# Patient Record
Sex: Female | Born: 1960
Health system: Southern US, Community
[De-identification: ages and names within clinical notes are randomized; demographics above are authoritative.]

## PROBLEM LIST (undated history)

## (undated) DIAGNOSIS — B019 Varicella without complication: Secondary | ICD-10-CM

## (undated) DIAGNOSIS — F419 Anxiety disorder, unspecified: Secondary | ICD-10-CM

## (undated) DIAGNOSIS — F32A Depression, unspecified: Secondary | ICD-10-CM

## (undated) DIAGNOSIS — E785 Hyperlipidemia, unspecified: Secondary | ICD-10-CM

## (undated) DIAGNOSIS — M810 Age-related osteoporosis without current pathological fracture: Secondary | ICD-10-CM

## (undated) DIAGNOSIS — K921 Melena: Secondary | ICD-10-CM

## (undated) HISTORY — DX: Depression, unspecified: F32.A

## (undated) HISTORY — DX: Anxiety disorder, unspecified: F41.9

## (undated) HISTORY — DX: Varicella without complication: B01.9

## (undated) HISTORY — DX: Age-related osteoporosis without current pathological fracture: M81.0

## (undated) HISTORY — DX: Melena: K92.1

## (undated) HISTORY — DX: Hyperlipidemia, unspecified: E78.5

## (undated) HISTORY — PX: COLOSTOMY: SHX63

---

## 2011-03-04 HISTORY — PX: BREAST BIOPSY: SHX20

## 2012-02-17 LAB — HM COLONOSCOPY

## 2019-05-26 LAB — LIPID PANEL
Cholesterol: 185 (ref 0–200)
HDL: 80 — AB (ref 35–70)
LDL Cholesterol: 91
Triglycerides: 59 (ref 40–160)

## 2019-05-26 LAB — VITAMIN D 25 HYDROXY (VIT D DEFICIENCY, FRACTURES): Vit D, 25-Hydroxy: 38

## 2019-05-26 LAB — CBC AND DIFFERENTIAL
HCT: 49 — AB (ref 36–46)
Hemoglobin: 15.4 (ref 12.0–16.0)
Platelets: 274 (ref 150–399)
WBC: 7.2

## 2019-05-26 LAB — BASIC METABOLIC PANEL
BUN: 17 (ref 4–21)
CO2: 27 — AB (ref 13–22)
Chloride: 103 (ref 99–108)
Creatinine: 0.9 (ref 0.5–1.1)
Glucose: 75
Potassium: 4.6 (ref 3.4–5.3)
Sodium: 141 (ref 137–147)

## 2019-05-26 LAB — HEPATIC FUNCTION PANEL
ALT: 15 (ref 7–35)
AST: 19 (ref 13–35)
Alkaline Phosphatase: 35 (ref 25–125)
Bilirubin, Total: 0.5

## 2019-05-26 LAB — COMPREHENSIVE METABOLIC PANEL: Calcium: 9.6 (ref 8.7–10.7)

## 2019-05-26 LAB — VITAMIN B12: Vitamin B-12: 690

## 2019-06-15 LAB — HM PAP SMEAR

## 2019-06-15 LAB — HM MAMMOGRAPHY: HM Mammogram: NORMAL (ref 0–4)

## 2019-12-12 ENCOUNTER — Other Ambulatory Visit (HOSPITAL_COMMUNITY): Payer: Self-pay

## 2019-12-22 ENCOUNTER — Other Ambulatory Visit (HOSPITAL_COMMUNITY): Payer: Self-pay

## 2020-01-09 MED FILL — CITALOPRAM HBR 20 MG TABLET: 20 | 60 days supply | Qty: 60 | Fill #0

## 2020-01-10 MED FILL — ALENDRONATE NA 70 MG TAB: 70 | 56 days supply | Qty: 8 | Fill #0

## 2020-01-17 MED FILL — SIMVASTATIN 40 MG TABLET: 40 | 30 days supply | Qty: 30 | Fill #0

## 2020-02-06 ENCOUNTER — Telehealth: Payer: Self-pay | Admitting: Endocrinology

## 2020-02-06 ENCOUNTER — Other Ambulatory Visit: Payer: Self-pay

## 2020-02-06 ENCOUNTER — Ambulatory Visit: Payer: 59 | Admitting: Endocrinology

## 2020-02-06 ENCOUNTER — Encounter: Payer: Self-pay | Admitting: Endocrinology

## 2020-02-06 DIAGNOSIS — M810 Age-related osteoporosis without current pathological fracture: Secondary | ICD-10-CM

## 2020-02-06 MED ORDER — ALENDRONATE SODIUM 70 MG PO TABS
70.0000 mg | ORAL_TABLET | ORAL | 3 refills | Status: DC
Start: 2020-02-06 — End: 2020-03-13

## 2020-02-06 NOTE — Progress Notes (Signed)
 Subjective:    Patient ID: Angela Lam, female    DOB: 07/29/1960, 59 y.o.   MRN: [id number]  HPI Pt is referred by Dr Mendolsohn, for osteoporosis.  Pt was noted to have osteoporosis in 2016.  She first took Actonel 2019-2020.  She then took a course of Tymlos 2020-2021.  She has been on Fosamax since then.  Only bony fracture was left little toe (2018--traumatic).  She had menopause at age 50.  She has no history of any of the following: multiple myeloma, renal dz, thyroid problems, steroids, alcoholism, smoking, liver dz, and  hyperparathyroidism.  She does not take heparin or anticonvulsants.  She takes vit-D, 1600 units/day.  Main symptom is low back pain.    Social History   Socioeconomic History  . Marital status: Married    Spouse name: Not on file  . Number of children: Not on file  . Years of education: Not on file  . Highest education level: Not on file  Occupational History  . Not on file  Tobacco Use  . Smoking status: Not on file  . Smokeless tobacco: Not on file  Substance and Sexual Activity  . Alcohol use: Not on file  . Drug use: Not on file  . Sexual activity: Not on file  Other Topics Concern  . Not on file  Social History Narrative  . Not on file   Social Determinants of Health   Financial Resource Strain: Not on file  Food Insecurity: Not on file  Transportation Needs: Not on file  Physical Activity: Not on file  Stress: Not on file  Social Connections: Not on file  Intimate Partner Violence: Not on file    Current Outpatient Medications on File Prior to Visit  Medication Sig Dispense Refill  . Calcium Carbonate-Vitamin D (CALCIUM 600+D) 600-400 MG-UNIT tablet     . citalopram (CELEXA) 20 MG tablet Take 20 mg by mouth daily.    . simvastatin (ZOCOR) 40 MG tablet Take 40 mg by mouth at bedtime.     No current facility-administered medications on file prior to visit.    Not on File  Family History  Problem Relation Age of Onset  .  Osteoporosis Maternal Grandmother     BP 118/70   Pulse 88   Ht 5' 1.5" (1.562 m)   Wt 131 lb 6.4 oz (59.6 kg)   SpO2 99%   BMI 24.43 kg/m    Review of Systems denies heartburn, cold intolerance, falls, muscle cramps, and memory loss.  She has gained a few lbs.       Objective:   Physical Exam VITAL SIGNS:  See vs page GENERAL: no distress SPINE: no kyphosis GAIT: normal and steady  DEXA (1/21) worst T-score was -2.4 (RFN).  Ca++=10.0 Creat=0.8  I have reviewed outside records, and summarized:  Pt was noted to have osteoporosis, and referred here.  She was seen in 2020, when BMD was worse.  Tymlos was added.       Assessment & Plan:  Osteoporosis, new to me.  Uncontrolled.    Patient Instructions  Please continue the same Fosamax We are requesting prior authorization, for Prolia.   you will receive a phone call, about a day and time for an appointment. Please continue the same Vitamin-D. Please come back for a follow-up appointment in 6 months.      Osteoporosis  Osteoporosis is thinning and loss of density in your bones. Osteoporosis makes bones more brittle and fragile and more   likely to break (fracture). Over time, osteoporosis can cause your bones to become so weak that they fracture after a minor fall. Bones in the hip, wrist, and spine are most likely to fracture due to osteoporosis. What are the causes? The exact cause of this condition is not known. What increases the risk? You may be at greater risk for osteoporosis if you:  Have a family history of the condition.  Have poor nutrition.  Use steroid medicines, such as prednisone.  Are female.  Are age 50 or older.  Smoke or have a history of smoking.  Are not physically active (are sedentary).  Are white (Caucasian) or of Asian descent.  Have a small body frame.  Take certain medicines, such as antiseizure medicines. What are the signs or symptoms? A fracture might be the first sign of  osteoporosis, especially if the fracture results from a fall or injury that usually would not cause a bone to break. Other signs and symptoms include:  Pain in the neck or low back.  Stooped posture.  Loss of height. How is this diagnosed? This condition may be diagnosed based on:  Your medical history.  A physical exam.  A bone mineral density test, also called a DXA or DEXA test (dual-energy X-ray absorptiometry test). This test uses X-rays to measure the amount of minerals in your bones. How is this treated? The goal of treatment is to strengthen your bones and lower your risk for a fracture. Treatment may involve:  Making lifestyle changes, such as: ? Including foods with more calcium and vitamin D in your diet. ? Doing weight-bearing and muscle-strengthening exercises. ? Stopping tobacco use. ? Limiting alcohol intake.  Taking medicine to slow the process of bone loss or to increase bone density.  Taking daily supplements of calcium and vitamin D.  Taking hormone replacement medicines, such as estrogen for women and testosterone for men.  Monitoring your levels of calcium and vitamin D. Follow these instructions at home:  Activity  Exercise as told by your health care provider. Ask your health care provider what exercises and activities are safe for you. You should do: ? Exercises that make you work against gravity (weight-bearing exercises), such as tai chi, yoga, or walking. ? Exercises to strengthen muscles, such as lifting weights. Lifestyle  Limit alcohol intake to no more than 1 drink a day for nonpregnant women and 2 drinks a day for men. One drink equals 12 oz of beer, 5 oz of wine, or 1 oz of hard liquor.  Do not use any products that contain nicotine or tobacco, such as cigarettes and e-cigarettes. If you need help quitting, ask your health care provider. Preventing falls  Use devices to help you move around (mobility aids) as needed, such as canes,  walkers, scooters, or crutches.  Keep rooms well-lit and clutter-free.  Remove tripping hazards from walkways, including cords and throw rugs.  Install grab bars in bathrooms and safety rails on stairs.  Use rubber mats in the bathroom and other areas that are often wet or slippery.  Wear closed-toe shoes that fit well and support your feet. Wear shoes that have rubber soles or low heels.  Review your medicines with your health care provider. Some medicines can cause dizziness or changes in blood pressure, which can increase your risk of falling. General instructions  Include calcium and vitamin D in your diet. Calcium is important for bone health, and vitamin D helps your body to absorb calcium. Good sources of   calcium and vitamin D include: ? Certain fatty fish, such as salmon and tuna. ? Products that have calcium and vitamin D added to them (fortified products), such as fortified cereals. ? Egg yolks. ? Cheese. ? Liver.  Take over-the-counter and prescription medicines only as told by your health care provider.  Keep all follow-up visits as told by your health care provider. This is important. Contact a health care provider if:  You have never been screened for osteoporosis and you are: ? A woman who is age 65 or older. ? A man who is age 70 or older. Get help right away if:  You fall or injure yourself. Summary  Osteoporosis is thinning and loss of density in your bones. This makes bones more brittle and fragile and more likely to break (fracture),even with minor falls.  The goal of treatment is to strengthen your bones and reduce your risk for a fracture.  Include calcium and vitamin D in your diet. Calcium is important for bone health, and vitamin D helps your body to absorb calcium.  Talk with your health care provider about screening for osteoporosis if you are a woman who is age 65 or older, or a man who is age 70 or older. This information is not intended to  replace advice given to you by your health care provider. Make sure you discuss any questions you have with your health care provider. Document Revised: 01/30/2017 Document Reviewed: 12/12/2016 Elsevier Patient Education  2020 [employer]    

## 2020-02-06 NOTE — Patient Instructions (Addendum)
Please continue the same Fosamax We are requesting prior authorization, for Prolia.   you will receive a phone call, about a day and time for an appointment. Please continue the same Vitamin-D. Please come back for a follow-up appointment in 6 months.      Osteoporosis  Osteoporosis is thinning and loss of density in your bones. Osteoporosis makes bones more brittle and fragile and more likely to break (fracture). Over time, osteoporosis can cause your bones to become so weak that they fracture after a minor fall. Bones in the hip, wrist, and spine are most likely to fracture due to osteoporosis. What are the causes? The exact cause of this condition is not known. What increases the risk? You may be at greater risk for osteoporosis if you:  Have a family history of the condition.  Have poor nutrition.  Use steroid medicines, such as prednisone.  Are female.  Are age 19 or older.  Smoke or have a history of smoking.  Are not physically active (are sedentary).  Are white (Caucasian) or of Asian descent.  Have a small body frame.  Take certain medicines, such as antiseizure medicines. What are the signs or symptoms? A fracture might be the first sign of osteoporosis, especially if the fracture results from a fall or injury that usually would not cause a bone to break. Other signs and symptoms include:  Pain in the neck or low back.  Stooped posture.  Loss of height. How is this diagnosed? This condition may be diagnosed based on:  Your medical history.  A physical exam.  A bone mineral density test, also called a DXA or DEXA test (dual-energy X-ray absorptiometry test). This test uses X-rays to measure the amount of minerals in your bones. How is this treated? The goal of treatment is to strengthen your bones and lower your risk for a fracture. Treatment may involve:  Making lifestyle changes, such as: ? Including foods with more calcium and vitamin D in your  diet. ? Doing weight-bearing and muscle-strengthening exercises. ? Stopping tobacco use. ? Limiting alcohol intake.  Taking medicine to slow the process of bone loss or to increase bone density.  Taking daily supplements of calcium and vitamin D.  Taking hormone replacement medicines, such as estrogen for women and testosterone for men.  Monitoring your levels of calcium and vitamin D. Follow these instructions at home:  Activity  Exercise as told by your health care provider. Ask your health care provider what exercises and activities are safe for you. You should do: ? Exercises that make you work against gravity (weight-bearing exercises), such as tai chi, yoga, or walking. ? Exercises to strengthen muscles, such as lifting weights. Lifestyle  Limit alcohol intake to no more than 1 drink a day for nonpregnant women and 2 drinks a day for men. One drink equals 12 oz of beer, 5 oz of wine, or 1 oz of hard liquor.  Do not use any products that contain nicotine or tobacco, such as cigarettes and e-cigarettes. If you need help quitting, ask your health care provider. Preventing falls  Use devices to help you move around (mobility aids) as needed, such as canes, walkers, scooters, or crutches.  Keep rooms well-lit and clutter-free.  Remove tripping hazards from walkways, including cords and throw rugs.  Install grab bars in bathrooms and safety rails on stairs.  Use rubber mats in the bathroom and other areas that are often wet or slippery.  Wear closed-toe shoes that fit well  and support your feet. Wear shoes that have rubber soles or low heels.  Review your medicines with your health care provider. Some medicines can cause dizziness or changes in blood pressure, which can increase your risk of falling. General instructions  Include calcium and vitamin D in your diet. Calcium is important for bone health, and vitamin D helps your body to absorb calcium. Good sources of calcium  and vitamin D include: ? Certain fatty fish, such as salmon and tuna. ? Products that have calcium and vitamin D added to them (fortified products), such as fortified cereals. ? Egg yolks. ? Cheese. ? Liver.  Take over-the-counter and prescription medicines only as told by your health care provider.  Keep all follow-up visits as told by your health care provider. This is important. Contact a health care provider if:  You have never been screened for osteoporosis and you are: ? A woman who is age 70 or older. ? A man who is age 2 or older. Get help right away if:  You fall or injure yourself. Summary  Osteoporosis is thinning and loss of density in your bones. This makes bones more brittle and fragile and more likely to break (fracture),even with minor falls.  The goal of treatment is to strengthen your bones and reduce your risk for a fracture.  Include calcium and vitamin D in your diet. Calcium is important for bone health, and vitamin D helps your body to absorb calcium.  Talk with your health care provider about screening for osteoporosis if you are a woman who is age 60 or older, or a man who is age 67 or older. This information is not intended to replace advice given to you by your health care provider. Make sure you discuss any questions you have with your health care provider. Document Revised: 01/30/2017 Document Reviewed: 12/12/2016 Elsevier Patient Education  2020 [employer].

## 2020-02-06 NOTE — Telephone Encounter (Signed)
Please start PA.  Thank you.

## 2020-02-06 NOTE — Telephone Encounter (Signed)
Please do PA for Prolia, and notify pt when she can come in for her first shot.  TY

## 2020-02-09 DIAGNOSIS — M810 Age-related osteoporosis without current pathological fracture: Secondary | ICD-10-CM | POA: Insufficient documentation

## 2020-02-11 NOTE — Telephone Encounter (Signed)
Patient submitted to the portal for insurance verification

## 2020-02-20 MED FILL — SIMVASTATIN 40 MG TABLET: 40 | 30 days supply | Qty: 30 | Fill #1

## 2020-02-28 NOTE — Telephone Encounter (Signed)
Patient called wanting to check the status of Prolia.

## 2020-03-07 ENCOUNTER — Other Ambulatory Visit (HOSPITAL_COMMUNITY): Payer: Self-pay

## 2020-03-08 MED FILL — CITALOPRAM HBR 20 MG TABLET: 20 | 90 days supply | Qty: 90 | Fill #0

## 2020-03-13 ENCOUNTER — Other Ambulatory Visit: Payer: Self-pay | Admitting: Endocrinology

## 2020-03-13 MED FILL — ALENDRONATE NA 70 MG TAB: 70 | 56 days supply | Qty: 8 | Fill #0

## 2020-03-21 ENCOUNTER — Other Ambulatory Visit: Payer: Self-pay

## 2020-03-21 ENCOUNTER — Ambulatory Visit: Payer: 59 | Admitting: Internal Medicine

## 2020-03-21 ENCOUNTER — Encounter: Payer: Self-pay | Admitting: Internal Medicine

## 2020-03-21 VITALS — BP 122/80 | HR 93 | Temp 98.5°F | Resp 18 | Ht 62.0 in | Wt 131.6 lb

## 2020-03-21 DIAGNOSIS — Z23 Encounter for immunization: Secondary | ICD-10-CM

## 2020-03-21 DIAGNOSIS — E785 Hyperlipidemia, unspecified: Secondary | ICD-10-CM | POA: Diagnosis not present

## 2020-03-21 MED ORDER — SIMVASTATIN 40 MG PO TABS: 40.0000 mg | ORAL_TABLET | Freq: Every day | ORAL | 1 refills | Status: DC

## 2020-03-21 NOTE — Patient Instructions (Signed)
Health Maintenance, Female Adopting a healthy lifestyle and getting preventive care are important in promoting health and wellness. Ask your health care provider about:  The right schedule for you to have regular tests and exams.  Things you can do on your own to prevent diseases and keep yourself healthy. What should I know about diet, weight, and exercise? Eat a healthy diet  Eat a diet that includes plenty of vegetables, fruits, low-fat dairy products, and lean protein.  Do not eat a lot of foods that are high in solid fats, added sugars, or sodium.   Maintain a healthy weight Body mass index (BMI) is used to identify weight problems. It estimates body fat based on height and weight. Your health care provider can help determine your BMI and help you achieve or maintain a healthy weight. Get regular exercise Get regular exercise. This is one of the most important things you can do for your health. Most adults should:  Exercise for at least 150 minutes each week. The exercise should increase your heart rate and make you sweat (moderate-intensity exercise).  Do strengthening exercises at least twice a week. This is in addition to the moderate-intensity exercise.  Spend less time sitting. Even light physical activity can be beneficial. Watch cholesterol and blood lipids Have your blood tested for lipids and cholesterol at 59 years of age, then have this test every 5 years. Have your cholesterol levels checked more often if:  Your lipid or cholesterol levels are high.  You are older than 59 years of age.  You are at high risk for heart disease. What should I know about cancer screening? Depending on your health history and family history, you may need to have cancer screening at various ages. This may include screening for:  Breast cancer.  Cervical cancer.  Colorectal cancer.  Skin cancer.  Lung cancer. What should I know about heart disease, diabetes, and high blood  pressure? Blood pressure and heart disease  High blood pressure causes heart disease and increases the risk of stroke. This is more likely to develop in people who have high blood pressure readings, are of African descent, or are overweight.  Have your blood pressure checked: ? Every 3-5 years if you are 18-39 years of age. ? Every year if you are 40 years old or older. Diabetes Have regular diabetes screenings. This checks your fasting blood sugar level. Have the screening done:  Once every three years after age 40 if you are at a normal weight and have a low risk for diabetes.  More often and at a younger age if you are overweight or have a high risk for diabetes. What should I know about preventing infection? Hepatitis B If you have a higher risk for hepatitis B, you should be screened for this virus. Talk with your health care provider to find out if you are at risk for hepatitis B infection. Hepatitis C Testing is recommended for:  Everyone born from 1945 through 1965.  Anyone with known risk factors for hepatitis C. Sexually transmitted infections (STIs)  Get screened for STIs, including gonorrhea and chlamydia, if: ? You are sexually active and are younger than 59 years of age. ? You are older than 59 years of age and your health care provider tells you that you are at risk for this type of infection. ? Your sexual activity has changed since you were last screened, and you are at increased risk for chlamydia or gonorrhea. Ask your health care provider   if you are at risk.  Ask your health care provider about whether you are at high risk for HIV. Your health care provider may recommend a prescription medicine to help prevent HIV infection. If you choose to take medicine to prevent HIV, you should first get tested for HIV. You should then be tested every 3 months for as long as you are taking the medicine. Pregnancy  If you are about to stop having your period (premenopausal) and  you may become pregnant, seek counseling before you get pregnant.  Take 400 to 800 micrograms (mcg) of folic acid every day if you become pregnant.  Ask for birth control (contraception) if you want to prevent pregnancy. Osteoporosis and menopause Osteoporosis is a disease in which the bones lose minerals and strength with aging. This can result in bone fractures. If you are 65 years old or older, or if you are at risk for osteoporosis and fractures, ask your health care provider if you should:  Be screened for bone loss.  Take a calcium or vitamin D supplement to lower your risk of fractures.  Be given hormone replacement therapy (HRT) to treat symptoms of menopause. Follow these instructions at home: Lifestyle  Do not use any products that contain nicotine or tobacco, such as cigarettes, e-cigarettes, and chewing tobacco. If you need help quitting, ask your health care provider.  Do not use street drugs.  Do not share needles.  Ask your health care provider for help if you need support or information about quitting drugs. Alcohol use  Do not drink alcohol if: ? Your health care provider tells you not to drink. ? You are pregnant, may be pregnant, or are planning to become pregnant.  If you drink alcohol: ? Limit how much you use to 0-1 drink a day. ? Limit intake if you are breastfeeding.  Be aware of how much alcohol is in your drink. In the [place] one drink equals one 12 oz bottle of beer (355 mL), one 5 oz glass of wine (148 mL), or one 1 oz glass of hard liquor (44 mL). General instructions  Schedule regular health, dental, and eye exams.  Stay current with your vaccines.  Tell your health care provider if: ? You often feel depressed. ? You have ever been abused or do not feel safe at home. Summary  Adopting a healthy lifestyle and getting preventive care are important in promoting health and wellness.  Follow your health care provider's instructions about healthy  diet, exercising, and getting tested or screened for diseases.  Follow your health care provider's instructions on monitoring your cholesterol and blood pressure. This information is not intended to replace advice given to you by your health care provider. Make sure you discuss any questions you have with your health care provider. Document Revised: 02/10/2018 Document Reviewed: 02/10/2018 Elsevier Patient Education  2021 [employer].  

## 2020-03-21 NOTE — Progress Notes (Signed)
Subjective:  Patient ID: Angela Lam, female    DOB: 12-28-1960  Age: 60 y.o. MRN: [id number]  CC: New Patient (Initial Visit) (No concerns at this time. )  This visit occurred during the SARS-CoV-2 public health emergency.  Safety protocols were in place, including screening questions prior to the visit, additional usage of staff PPE, and extensive cleaning of exam room while observing appropriate contact time as indicated for disinfecting solutions.    HPI Angela Lam presents for establishing.  She recently moved from [place] to in [facility].  She has felt well recently and offers no complaints.  She has no issues or concerns today.  Just wants to establish with a new PCP.  History Angela Lam has a past medical history of Blood in stool, Chicken pox, Depression, and Hyperlipidemia.   She has a past surgical history that includes Breast biopsy.   Her family history includes Arthritis in her sister; Asthma in her mother and sister; COPD in her mother and sister; Cancer in her father and mother; Depression in her sister; Diabetes in her mother and sister; Early death in her mother; Heart attack in her mother and sister; Heart disease in her father, mother, and sister; Hyperlipidemia in her mother and sister; Hypertension in her sister; Kidney disease in her mother; Miscarriages / Stillbirths in her mother; Osteoporosis in her maternal grandmother; Stroke in her mother.She reports that she has never smoked. She has never used smokeless tobacco. She reports current alcohol use. She reports that she does not use drugs.  Outpatient Medications Prior to Visit  Medication Sig Dispense Refill  . alendronate (FOSAMAX) 70 MG tablet TAKE 1 TABLET BY MOUTH ONCE A WEEK 30 MINUTES BEFORE FIRST MEAL OR BEVERAGE OF THE DAY. 8 tablet 0  . aspirin EC 81 MG tablet Take 81 mg by mouth every other day.    . Calcium Carbonate-Vitamin D 600-400 MG-UNIT tablet     . citalopram (CELEXA) 20  MG tablet Take 20 mg by mouth daily.    . simvastatin (ZOCOR) 40 MG tablet Take 40 mg by mouth at bedtime.     No facility-administered medications prior to visit.    ROS Review of Systems  Constitutional: Negative.  Negative for appetite change, diaphoresis, fatigue and unexpected weight change.  HENT: Negative.   Eyes: Negative for visual disturbance.  Respiratory: Negative for cough, chest tightness, shortness of breath and wheezing.   Cardiovascular: Negative for chest pain, palpitations and leg swelling.  Gastrointestinal: Negative for abdominal pain, constipation, diarrhea, nausea and vomiting.  Endocrine: Negative.   Genitourinary: Negative.  Negative for difficulty urinating.  Musculoskeletal: Negative for arthralgias, back pain, myalgias and neck pain.  Skin: Negative.  Negative for color change.  Neurological: Negative.  Negative for dizziness, weakness, light-headedness and headaches.  Hematological: Negative for adenopathy. Does not bruise/bleed easily.  Psychiatric/Behavioral: Negative.     Objective:  BP 122/80   Pulse 93   Temp 98.5 F (36.9 C) (Oral)   Resp 18   Ht 5\' 2"  (1.575 m)   Wt 131 lb 9.6 oz (59.7 kg)   SpO2 97%   BMI 24.07 kg/m   Physical Exam Vitals reviewed.  Constitutional:      Appearance: Normal appearance.  HENT:     Nose: Nose normal.     Mouth/Throat:     Mouth: Mucous membranes are moist.  Eyes:     General: No scleral icterus.    Conjunctiva/sclera: Conjunctivae normal.  Cardiovascular:  Rate and Rhythm: Normal rate and regular rhythm.     Heart sounds: No murmur heard.   Pulmonary:     Effort: Pulmonary effort is normal.     Breath sounds: No stridor. No wheezing, rhonchi or rales.  Abdominal:     General: Abdomen is flat. Bowel sounds are normal. There is no distension.     Palpations: Abdomen is soft. There is no hepatomegaly, splenomegaly or mass.     Tenderness: There is no abdominal tenderness.  Musculoskeletal:         General: Normal range of motion.     Cervical back: Neck supple.     Right lower leg: No edema.     Left lower leg: No edema.  Lymphadenopathy:     Cervical: No cervical adenopathy.  Skin:    General: Skin is warm and dry.  Neurological:     General: No focal deficit present.     Mental Status: She is alert.  Psychiatric:        Mood and Affect: Mood normal.        Behavior: Behavior normal.     Lab Results  Component Value Date   WBC 7.2 05/26/2019   HGB 15.4 05/26/2019   HCT 49 (A) 05/26/2019   PLT 274 05/26/2019   CHOL 185 05/26/2019   TRIG 59 05/26/2019   HDL 80 (A) 05/26/2019   LDLCALC 91 05/26/2019   ALT 15 05/26/2019   AST 19 05/26/2019   NA 141 05/26/2019   K 4.6 05/26/2019   CL 103 05/26/2019   CREATININE 0.9 05/26/2019   BUN 17 05/26/2019   CO2 27 (A) 05/26/2019    Assessment & Plan:   Angela Lam was seen today for new patient (initial visit).  Diagnoses and all orders for this visit:  Hyperlipidemia with target LDL less than 130- She has achieved her LDL goal and is doing well on the statin. -     simvastatin (ZOCOR) 40 MG tablet; Take 1 tablet (40 mg total) by mouth at bedtime.  Other orders -     Varicella-zoster vaccine IM (Shingrix)   I have changed Angela Lam simvastatin. I am also having her maintain her Calcium Carbonate-Vitamin D, citalopram, alendronate, and aspirin EC.  Meds ordered this encounter  Medications  . simvastatin (ZOCOR) 40 MG tablet    Sig: Take 1 tablet (40 mg total) by mouth at bedtime.    Dispense:  90 tablet    Refill:  1     Follow-up: Return in about 3 months (around 06/19/2020).  [name], MD

## 2020-03-27 MED FILL — SIMVASTATIN 40 MG TABLET: 40 | 30 days supply | Qty: 30 | Fill #2

## 2020-04-13 ENCOUNTER — Encounter: Payer: Self-pay | Admitting: Endocrinology

## 2020-04-19 ENCOUNTER — Encounter: Payer: Self-pay | Admitting: Endocrinology

## 2020-04-20 ENCOUNTER — Telehealth: Payer: Self-pay

## 2020-04-20 DIAGNOSIS — M810 Age-related osteoporosis without current pathological fracture: Secondary | ICD-10-CM

## 2020-04-20 NOTE — Telephone Encounter (Signed)
Last VOB done 122021  Initiated new VOB for 2022 via [facility] portal.

## 2020-04-23 ENCOUNTER — Other Ambulatory Visit (HOSPITAL_COMMUNITY): Payer: Self-pay

## 2020-04-23 MED FILL — SIMVASTATIN 40 MG TABLET: 40 | 30 days supply | Qty: 30 | Fill #0

## 2020-04-24 NOTE — Telephone Encounter (Signed)
COVERAGE AVAILABLE: Yes  COVERAGE DETAILS: Product is covered at 100%. Administration is subject to a $300.00 deductible ($25.00 met), $60 copay and $7,900.00 out of pocket max ($25.00 met). The benefits provided on this Verification of Benefits form are Medical Benefits and are the patient's In-Network benefits for Prolia. If you would like Pharmacy Benefits for Prolia, please call [phone].  AUTHORIZATION REQUIRED: Yes  PA PROCESS DETAILS: Prior Authorization is Required. We are unable to confirm if a PA is on file. Please check your records or contact the payer.

## 2020-04-24 NOTE — Telephone Encounter (Addendum)
PA pending: [id number], [id number] [facility])

## 2020-04-30 ENCOUNTER — Other Ambulatory Visit: Payer: Self-pay | Admitting: Endocrinology

## 2020-05-01 NOTE — Telephone Encounter (Signed)
Please advise. Ok to refills

## 2020-05-03 ENCOUNTER — Encounter: Payer: Self-pay | Admitting: Endocrinology

## 2020-05-03 ENCOUNTER — Other Ambulatory Visit: Payer: Self-pay

## 2020-05-03 MED ORDER — ALENDRONATE SODIUM 70 MG PO TABS: ORAL_TABLET | ORAL | 0 refills | Status: DC

## 2020-05-03 MED FILL — ALENDRONATE NA 70 MG TAB: 70 | 56 days supply | Qty: 8 | Fill #0

## 2020-05-04 NOTE — Telephone Encounter (Signed)
Please refill prn 

## 2020-05-15 NOTE — Telephone Encounter (Signed)
Details Status: Denied group number [id number] Comments: This online request and pending case Confirmation Date:  Selected Patient Member ID: [id number] Member Name: Angela Lam Group Number: [id number] [place] Number: [id number]  Requester Information Requester's Name: Josepha Pigg Requester's Phone Number: [phone] Requester's Department: endocrinology Requester's E-mail Address: [email or web address] Patient Information Patient's Home Phone:  Patient's Name: Angela Lam Patient's Date-of-Birth: 1960-04-11 Employee's Name: [employer] [facility] Setting: Outpatient  Urgency: Elective  Diagnosis  Code Description m81.0 Osteoporosis Service Code  CPT Code Description Treatment Type Place Of Service Contract Rate Diagnosis Number Of Unit Admission Date J0897 Prolia 60 mg/mL Medical Office  m81.0 60 2020-05-01  Facility Information Name: Wilhemina Cash PA TIN: [id number] Address: [place], [place] [address] Network Name: [facility] Provider Information Name: Renato [facility] Provider Identifier: [id number] Address: [place] and ZIP: [place] [address] TIN: [id number] Network Name: [facility] Notes Clinical Documents No File Uploaded. Notes 01/29/18 Tscore -3.2  Status and Comments Status: Denied Comments: This online request and pending case  Transaction Submission Confirmation  Case ID# [id number] was submitted to [facility] on 04/27/2020 by Josepha Pigg

## 2020-05-17 ENCOUNTER — Other Ambulatory Visit: Payer: Self-pay | Admitting: Endocrinology

## 2020-05-17 MED ORDER — DENOSUMAB 60 MG/ML ~~LOC~~ SOSY: 60.0000 mg | PREFILLED_SYRINGE | SUBCUTANEOUS | 1 refills | Status: DC

## 2020-05-23 MED FILL — SIMVASTATIN 40 MG TABLET: 40 | 30 days supply | Qty: 30 | Fill #1

## 2020-05-30 ENCOUNTER — Telehealth: Payer: Self-pay

## 2020-05-30 NOTE — Telephone Encounter (Signed)
Ok, I'll see you next time.   

## 2020-05-30 NOTE — Telephone Encounter (Signed)
Looks like the PA for Prolia has been denied  Please advise on what to do.

## 2020-05-30 NOTE — Telephone Encounter (Signed)
Spoke with Charletta Cousin E from the Pharmacy Help desk to resubmit a PA for prolia for pt. She stated that the determination could take up to 24 business days for an answer.

## 2020-06-01 ENCOUNTER — Telehealth: Payer: Self-pay

## 2020-06-01 NOTE — Telephone Encounter (Addendum)
Pt has been approved for Prolia 60mg /ml injections from 06/01/2020-05/31/2021

## 2020-06-04 ENCOUNTER — Other Ambulatory Visit: Payer: Self-pay | Admitting: Internal Medicine

## 2020-06-04 ENCOUNTER — Other Ambulatory Visit (HOSPITAL_COMMUNITY): Payer: Self-pay

## 2020-06-04 MED ORDER — CITALOPRAM HYDROBROMIDE 20 MG PO TABS
ORAL_TABLET | ORAL | 0 refills | Status: DC
  Filled 2020-06-04: qty 90, 90d supply, fill #0

## 2020-06-05 ENCOUNTER — Telehealth: Payer: Self-pay | Admitting: Pharmacist

## 2020-06-05 ENCOUNTER — Other Ambulatory Visit (HOSPITAL_COMMUNITY): Payer: Self-pay

## 2020-06-05 NOTE — Telephone Encounter (Signed)
Called patient to schedule an appointment for the [employer] [facility]. I was unable to reach the patient so I left a HIPAA-compliant message requesting that the patient return my call.   Luke Van Ausdall, PharmD, BCACP, CPP Clinical Pharmacist [facility] [phone]  

## 2020-06-05 NOTE — Telephone Encounter (Signed)
Casandra Doffing, CMA     06/01/20 11:46 AM Note Pt has been approved for Prolia 60mg /ml injections from 06/01/2020-05/31/2021

## 2020-06-06 ENCOUNTER — Other Ambulatory Visit (HOSPITAL_COMMUNITY): Payer: Self-pay

## 2020-06-06 MED FILL — Denosumab Inj Soln Prefilled Syringe 60 MG/ML: SUBCUTANEOUS | 180 days supply | Qty: 1 | Fill #0 | Status: CN

## 2020-06-07 ENCOUNTER — Ambulatory Visit: Payer: 59 | Attending: Family Medicine | Admitting: Pharmacist

## 2020-06-07 ENCOUNTER — Other Ambulatory Visit: Payer: Self-pay

## 2020-06-07 ENCOUNTER — Other Ambulatory Visit (HOSPITAL_COMMUNITY): Payer: Self-pay

## 2020-06-07 DIAGNOSIS — Z79899 Other long term (current) drug therapy: Secondary | ICD-10-CM

## 2020-06-07 MED ORDER — DENOSUMAB 60 MG/ML ~~LOC~~ SOSY
PREFILLED_SYRINGE | SUBCUTANEOUS | 1 refills | Status: DC
  Filled 2020-06-07: qty 1, fill #0
  Filled 2020-06-08: qty 1, 180d supply, fill #0

## 2020-06-07 MED ORDER — DENOSUMAB 60 MG/ML ~~LOC~~ SOSY
PREFILLED_SYRINGE | SUBCUTANEOUS | 1 refills | Status: DC
  Filled 2020-06-07: qty 1, 180d supply, fill #0

## 2020-06-07 NOTE — Progress Notes (Signed)
S:  Patient presents for review of their specialty medication therapy.  Patient is about to start Prolia for osteoporosis. Patient is managed by Dr. Loanne Drilling for this. Her injections were recently approved. She was noted to have osteoporosis in 2016. Has 1 fracture in the left little toe (2018-traumatic).   Adherence: has not yet started   Efficacy: has not yet started   Dosing: 60mg  q60months  Dose adjustments: Renal: Monitor patients with severe impairment (CrCl <30 mL/minute or on dialysis) closely, as significant and prolonged hypocalcemia (incidence of 29% and potentially lasting weeks to months) and marked elevations of serum parathyroid hormone are serious risks in this population. Ensure adequate calcium and vitamin D intake/supplementation. CrCl ?30 mL/minute: No dosage adjustment necessary. CrCl <30 mL/minute: No dosage adjustment necessary; use in conjunction with guidance from patient's nephrology team. Hepatic: no dose adjustments (has not been studied)  Drug-drug interactions: none identified   Screening: TB test: completed  Hepatitis: completed   Monitoring: S/sx of infection: none  S/sx of hypersensitivity: none  S/sx of hypocalcemia/hypercalcemia: none  Dermatitis/skin rash: none  Peripheral edema: none  HA: none  GI upset: none   Other side effects: none   Last bone density study: DEXA scan 03/28/2019    O:      Lab Results  Component Value Date   WBC 7.2 05/26/2019   HGB 15.4 05/26/2019   HCT 49 (A) 05/26/2019   PLT 274 05/26/2019      Chemistry      Component Value Date/Time   NA 141 05/26/2019 0000   K 4.6 05/26/2019 0000   CL 103 05/26/2019 0000   CO2 27 (A) 05/26/2019 0000   BUN 17 05/26/2019 0000   CREATININE 0.9 05/26/2019 0000   GLU 75 05/26/2019 0000      Component Value Date/Time   CALCIUM 9.6 05/26/2019 0000   ALKPHOS 35 05/26/2019 0000   AST 19 05/26/2019 0000   ALT 15 05/26/2019 0000       A/P: 1. Medication review:  Patient is about to start Prolia for osteoporosis. Reviewed the medication with the patient, including the following: Prolia (denosumab) is a monoclonal antibody with affinity for nuclear factor-kappa ligand (RANKL). Prolia binds to RANKL and prevents osteoclast formation, leading to decreased bone resorption and increased bone mass in osteoporosis. Patient educated on purpose, proper use, and potential adverse effects of Prolia. The most common adverse effects are hypersensitivities, peripheral edema, dermatitis/skin rash, GI upset, HA, joint pain, and infection. There is the possibility of atypical femur fracture, serum calcium disturbances, and osteonecrosis of the jaw. Patients should monitor for and report hip, thigh, or groin pain. Additionally, patients should monitor for and report jaw pain, tooth/periodontal infection, toothache, and/or gingival ulceration/erosion. Prolia exists as a solution prefilled syringe for SQ administration. Administration: Denosumab is intended for SubQ route only and should not be administered IV, IM, or intradermally. Prior to administration, bring to room temperature in original container (allow to stand ~15 to 30 minutes); do not warm by any other method. Solution may contain trace amounts of translucent to white protein particles; do not use if cloudy, discolored (normal solution should be clear and colorless to pale yellow), or contains excessive particles or foreign matter. Avoid vigorous shaking. Administer via SubQ injection in the upper arm, upper thigh, or abdomen; should only be administered by a health care professional. No recommendations for any changes at this time.   Benard Halsted, PharmD, BCACP, [place]  [phone]  

## 2020-06-08 ENCOUNTER — Other Ambulatory Visit (HOSPITAL_COMMUNITY): Payer: Self-pay

## 2020-06-11 ENCOUNTER — Other Ambulatory Visit (HOSPITAL_COMMUNITY): Payer: Self-pay

## 2020-06-11 NOTE — Telephone Encounter (Signed)
PA Approved PA# [id number] Valid 06/01/20-05/31/21  Script must be filled at [facility]  Pt scheduled for 06/15/20

## 2020-06-12 ENCOUNTER — Other Ambulatory Visit (HOSPITAL_COMMUNITY): Payer: Self-pay

## 2020-06-14 ENCOUNTER — Other Ambulatory Visit: Payer: Self-pay

## 2020-06-14 ENCOUNTER — Ambulatory Visit (INDEPENDENT_AMBULATORY_CARE_PROVIDER_SITE_OTHER): Payer: 59

## 2020-06-14 DIAGNOSIS — M810 Age-related osteoporosis without current pathological fracture: Secondary | ICD-10-CM

## 2020-06-14 MED ORDER — DENOSUMAB 60 MG/ML ~~LOC~~ SOSY
60.0000 mg | PREFILLED_SYRINGE | Freq: Once | SUBCUTANEOUS | Status: AC
  Administered 2020-06-14: 60 mg via SUBCUTANEOUS

## 2020-06-14 NOTE — Progress Notes (Signed)
Prolia injection administered to pt's right arm. Pt waited in waiting room for 15 minutes to ensure no reaction. Pt tolerated injection well.

## 2020-06-15 ENCOUNTER — Ambulatory Visit: Payer: 59

## 2020-06-18 ENCOUNTER — Other Ambulatory Visit (HOSPITAL_COMMUNITY): Payer: Self-pay

## 2020-06-19 ENCOUNTER — Ambulatory Visit: Payer: 59 | Admitting: Internal Medicine

## 2020-06-19 ENCOUNTER — Other Ambulatory Visit: Payer: Self-pay

## 2020-06-19 ENCOUNTER — Encounter: Payer: Self-pay | Admitting: Internal Medicine

## 2020-06-19 VITALS — BP 116/72 | HR 75 | Temp 98.6°F | Ht 60.0 in | Wt 137.2 lb

## 2020-06-19 DIAGNOSIS — E785 Hyperlipidemia, unspecified: Secondary | ICD-10-CM | POA: Diagnosis not present

## 2020-06-19 DIAGNOSIS — K635 Polyp of colon: Secondary | ICD-10-CM

## 2020-06-19 DIAGNOSIS — Z23 Encounter for immunization: Secondary | ICD-10-CM | POA: Diagnosis not present

## 2020-06-19 DIAGNOSIS — Z1231 Encounter for screening mammogram for malignant neoplasm of breast: Secondary | ICD-10-CM | POA: Insufficient documentation

## 2020-06-19 DIAGNOSIS — Z0000 Encounter for general adult medical examination without abnormal findings: Secondary | ICD-10-CM | POA: Diagnosis not present

## 2020-06-19 LAB — CBC WITH DIFFERENTIAL/PLATELET
Basophils Absolute: 0.1 10*3/uL (ref 0.0–0.1)
Basophils Relative: 1 % (ref 0.0–3.0)
Eosinophils Absolute: 0.1 10*3/uL (ref 0.0–0.7)
Eosinophils Relative: 1.9 % (ref 0.0–5.0)
HCT: 44.7 % (ref 36.0–46.0)
Hemoglobin: 14.9 g/dL (ref 12.0–15.0)
Lymphocytes Relative: 26.3 % (ref 12.0–46.0)
Lymphs Abs: 1.6 10*3/uL (ref 0.7–4.0)
MCHC: 33.2 g/dL (ref 30.0–36.0)
MCV: 100.6 fl — ABNORMAL HIGH (ref 78.0–100.0)
Monocytes Absolute: 0.4 10*3/uL (ref 0.1–1.0)
Monocytes Relative: 5.9 % (ref 3.0–12.0)
Neutro Abs: 3.9 10*3/uL (ref 1.4–7.7)
Neutrophils Relative %: 64.9 % (ref 43.0–77.0)
Platelets: 249 10*3/uL (ref 150.0–400.0)
RBC: 4.44 Mil/uL (ref 3.87–5.11)
RDW: 13 % (ref 11.5–15.5)
WBC: 6 10*3/uL (ref 4.0–10.5)

## 2020-06-19 LAB — BASIC METABOLIC PANEL
BUN: 14 mg/dL (ref 6–23)
CO2: 30 mEq/L (ref 19–32)
Calcium: 9.3 mg/dL (ref 8.4–10.5)
Chloride: 104 mEq/L (ref 96–112)
Creatinine, Ser: 0.71 mg/dL (ref 0.40–1.20)
GFR: 93.35 mL/min (ref >60.00)
Glucose, Bld: 90 mg/dL (ref 70–99)
Potassium: 4.1 mEq/L (ref 3.5–5.1)
Sodium: 140 mEq/L (ref 135–145)

## 2020-06-19 LAB — HEPATIC FUNCTION PANEL
ALT: 20 U/L (ref 0–35)
AST: 20 U/L (ref 0–37)
Albumin: 3.9 g/dL (ref 3.5–5.2)
Alkaline Phosphatase: 19 U/L — ABNORMAL LOW (ref 39–117)
Bilirubin, Direct: 0.1 mg/dL (ref 0.0–0.3)
Total Bilirubin: 0.4 mg/dL (ref 0.2–1.2)
Total Protein: 6.7 g/dL (ref 6.0–8.3)

## 2020-06-19 LAB — TSH: TSH: 1.12 u[IU]/mL (ref 0.35–4.50)

## 2020-06-19 LAB — LIPID PANEL
Cholesterol: 191 mg/dL (ref 0–200)
HDL: 81.6 mg/dL (ref >39.00)
LDL Cholesterol: 97 mg/dL (ref 0–99)
NonHDL: 109.01
Total CHOL/HDL Ratio: 2
Triglycerides: 61 mg/dL (ref 0.0–149.0)
VLDL: 12.2 mg/dL (ref 0.0–40.0)

## 2020-06-19 NOTE — Progress Notes (Signed)
Subjective:  Patient ID: Angela Lam, female    DOB: Feb 12, 1961  Age: 60 y.o. MRN: [id number]  CC: Annual Exam  This visit occurred during the SARS-CoV-2 public health emergency.  Safety protocols were in place, including screening questions prior to the visit, additional usage of staff PPE, and extensive cleaning of exam room while observing appropriate contact time as indicated for disinfecting solutions.   HPI Angela Lam presents for a CPX and f/up -  She feels well  Outpatient Medications Prior to Visit  Medication Sig Dispense Refill  . alendronate (FOSAMAX) 70 MG tablet TAKE 1 TABLET BY MOUTH ONCE A WEEK 30 MINUTES BEFORE FIRST MEAL OR BEVERAGE OF THE DAY. 8 tablet 0  . aspirin EC 81 MG tablet Take 81 mg by mouth every other day.    . Calcium Carbonate-Vitamin D 600-400 MG-UNIT tablet     . citalopram (CELEXA) 20 MG tablet TAKE 1 TABLET (20 MG) BY MOUTH IN THE MORNING. 90 tablet 0  . denosumab (PROLIA) 60 MG/ML SOSY injection Inject 60 mg into the skin every 6 (six) months.    . simvastatin (ZOCOR) 40 MG tablet TAKE 1 TABLET BY MOUTH AT BEDTIME. 30 tablet 2  . alendronate (FOSAMAX) 70 MG tablet TAKE 1 TABLET BY MOUTH ONCE A WEEK 30 MINUTES BEFORE FIRST MEAL OR BEVERAGE OF THE DAY. 8 tablet 0  . citalopram (CELEXA) 20 MG tablet Take 20 mg by mouth daily.    . simvastatin (ZOCOR) 40 MG tablet Take 1 tablet (40 mg total) by mouth at bedtime. 90 tablet 1  . simvastatin (ZOCOR) 40 MG tablet TAKE 1 TABLET BY MOUTH AT BEDTIME 30 tablet 2   No facility-administered medications prior to visit.    ROS Review of Systems  Constitutional: Negative.  Negative for diaphoresis and fatigue.  HENT: Negative.   Eyes: Negative.   Respiratory: Negative for cough, chest tightness, shortness of breath and wheezing.   Cardiovascular: Negative for chest pain, palpitations and leg swelling.  Gastrointestinal: Negative for abdominal pain, constipation, diarrhea, nausea and vomiting.   Endocrine: Negative.   Genitourinary: Negative.  Negative for difficulty urinating.  Musculoskeletal: Negative for arthralgias and myalgias.  Skin: Negative.   Neurological: Negative.  Negative for dizziness, weakness, light-headedness, numbness and headaches.  Hematological: Negative for adenopathy. Does not bruise/bleed easily.  Psychiatric/Behavioral: Negative.     Objective:  BP 116/72 (BP Location: Left Arm, Patient Position: Sitting, Cuff Size: Normal)   Pulse 75   Temp 98.6 F (37 C) (Oral)   Ht 5' (1.524 m)   Wt 137 lb 3.2 oz (62.2 kg)   SpO2 98%   BMI 26.80 kg/m   BP Readings from Last 3 Encounters:  06/19/20 116/72  03/21/20 122/80  02/06/20 118/70    Wt Readings from Last 3 Encounters:  06/19/20 137 lb 3.2 oz (62.2 kg)  03/21/20 131 lb 9.6 oz (59.7 kg)  02/06/20 131 lb 6.4 oz (59.6 kg)    Physical Exam Vitals reviewed.  HENT:     Nose: Nose normal.     Mouth/Throat:     Mouth: Mucous membranes are moist.  Eyes:     General: No scleral icterus.    Conjunctiva/sclera: Conjunctivae normal.  Cardiovascular:     Rate and Rhythm: Normal rate and regular rhythm.     Heart sounds: No murmur heard.   Pulmonary:     Effort: Pulmonary effort is normal.     Breath sounds: No stridor. No wheezing, rhonchi or rales.  Abdominal:     General: Abdomen is flat. There is no distension.     Tenderness: There is no abdominal tenderness.  Musculoskeletal:        General: Normal range of motion.     Cervical back: Neck supple.     Right lower leg: No edema.     Left lower leg: No edema.  Skin:    General: Skin is warm and dry.     Coloration: Skin is not pale.  Neurological:     General: No focal deficit present.     Mental Status: She is alert.  Psychiatric:        Mood and Affect: Mood normal.        Behavior: Behavior normal.     Lab Results  Component Value Date   WBC 6.0 06/19/2020   HGB 14.9 06/19/2020   HCT 44.7 06/19/2020   PLT 249.0 06/19/2020    GLUCOSE 90 06/19/2020   CHOL 191 06/19/2020   TRIG 61.0 06/19/2020   HDL 81.60 06/19/2020   LDLCALC 97 06/19/2020   ALT 20 06/19/2020   AST 20 06/19/2020   NA 140 06/19/2020   K 4.1 06/19/2020   CL 104 06/19/2020   CREATININE 0.71 06/19/2020   BUN 14 06/19/2020   CO2 30 06/19/2020   TSH 1.12 06/19/2020    Patient was never admitted.  Assessment & Plan:   Angela Lam was seen today for annual exam.  Diagnoses and all orders for this visit:  Routine general medical examination at a health care facility- Exam completed, labs reviewed, vaccines reviewed and updated, cancer screenings addressed, patient education was given. -     Lipid panel; Future -     HIV Antibody (routine testing w rflx); Future -     Hepatitis C antibody; Future -     Hepatitis C antibody -     HIV Antibody (routine testing w rflx) -     Lipid panel  Hyperlipidemia with target LDL less than 130- She has achieved her LDL goal and is doing well on the statin. -     CBC with Differential/Platelet; Future -     Basic metabolic panel; Future -     TSH; Future -     Hepatic function panel; Future -     Hepatic function panel -     TSH -     Basic metabolic panel -     CBC with Differential/Platelet  Polyp of colon, unspecified part of colon, unspecified type -     Ambulatory referral to Gastroenterology  Visit for screening mammogram -     MM DIGITAL SCREENING BILATERAL; Future  Other orders -     Varicella-zoster vaccine IM (Shingrix)   I am having Angela Lam maintain her Calcium Carbonate-Vitamin D, aspirin EC, alendronate, simvastatin, citalopram, and denosumab.  No orders of the defined types were placed in this encounter.    Follow-up: Return in about 1 year (around 06/19/2021).  Angela Calico, MD

## 2020-06-19 NOTE — Patient Instructions (Signed)
Health Maintenance, Female Adopting a healthy lifestyle and getting preventive care are important in promoting health and wellness. Ask your health care provider about:  The right schedule for you to have regular tests and exams.  Things you can do on your own to prevent diseases and keep yourself healthy. What should I know about diet, weight, and exercise? Eat a healthy diet  Eat a diet that includes plenty of vegetables, fruits, low-fat dairy products, and lean protein.  Do not eat a lot of foods that are high in solid fats, added sugars, or sodium.   Maintain a healthy weight Body mass index (BMI) is used to identify weight problems. It estimates body fat based on height and weight. Your health care provider can help determine your BMI and help you achieve or maintain a healthy weight. Get regular exercise Get regular exercise. This is one of the most important things you can do for your health. Most adults should:  Exercise for at least 150 minutes each week. The exercise should increase your heart rate and make you sweat (moderate-intensity exercise).  Do strengthening exercises at least twice a week. This is in addition to the moderate-intensity exercise.  Spend less time sitting. Even light physical activity can be beneficial. Watch cholesterol and blood lipids Have your blood tested for lipids and cholesterol at 59 years of age, then have this test every 5 years. Have your cholesterol levels checked more often if:  Your lipid or cholesterol levels are high.  You are older than 59 years of age.  You are at high risk for heart disease. What should I know about cancer screening? Depending on your health history and family history, you may need to have cancer screening at various ages. This may include screening for:  Breast cancer.  Cervical cancer.  Colorectal cancer.  Skin cancer.  Lung cancer. What should I know about heart disease, diabetes, and high blood  pressure? Blood pressure and heart disease  High blood pressure causes heart disease and increases the risk of stroke. This is more likely to develop in people who have high blood pressure readings, are of African descent, or are overweight.  Have your blood pressure checked: ? Every 3-5 years if you are 18-39 years of age. ? Every year if you are 40 years old or older. Diabetes Have regular diabetes screenings. This checks your fasting blood sugar level. Have the screening done:  Once every three years after age 40 if you are at a normal weight and have a low risk for diabetes.  More often and at a younger age if you are overweight or have a high risk for diabetes. What should I know about preventing infection? Hepatitis B If you have a higher risk for hepatitis B, you should be screened for this virus. Talk with your health care provider to find out if you are at risk for hepatitis B infection. Hepatitis C Testing is recommended for:  Everyone born from 1945 through 1965.  Anyone with known risk factors for hepatitis C. Sexually transmitted infections (STIs)  Get screened for STIs, including gonorrhea and chlamydia, if: ? You are sexually active and are younger than 59 years of age. ? You are older than 59 years of age and your health care provider tells you that you are at risk for this type of infection. ? Your sexual activity has changed since you were last screened, and you are at increased risk for chlamydia or gonorrhea. Ask your health care provider   if you are at risk.  Ask your health care provider about whether you are at high risk for HIV. Your health care provider may recommend a prescription medicine to help prevent HIV infection. If you choose to take medicine to prevent HIV, you should first get tested for HIV. You should then be tested every 3 months for as long as you are taking the medicine. Pregnancy  If you are about to stop having your period (premenopausal) and  you may become pregnant, seek counseling before you get pregnant.  Take 400 to 800 micrograms (mcg) of folic acid every day if you become pregnant.  Ask for birth control (contraception) if you want to prevent pregnancy. Osteoporosis and menopause Osteoporosis is a disease in which the bones lose minerals and strength with aging. This can result in bone fractures. If you are 65 years old or older, or if you are at risk for osteoporosis and fractures, ask your health care provider if you should:  Be screened for bone loss.  Take a calcium or vitamin D supplement to lower your risk of fractures.  Be given hormone replacement therapy (HRT) to treat symptoms of menopause. Follow these instructions at home: Lifestyle  Do not use any products that contain nicotine or tobacco, such as cigarettes, e-cigarettes, and chewing tobacco. If you need help quitting, ask your health care provider.  Do not use street drugs.  Do not share needles.  Ask your health care provider for help if you need support or information about quitting drugs. Alcohol use  Do not drink alcohol if: ? Your health care provider tells you not to drink. ? You are pregnant, may be pregnant, or are planning to become pregnant.  If you drink alcohol: ? Limit how much you use to 0-1 drink a day. ? Limit intake if you are breastfeeding.  Be aware of how much alcohol is in your drink. In the [place] one drink equals one 12 oz bottle of beer (355 mL), one 5 oz glass of wine (148 mL), or one 1 oz glass of hard liquor (44 mL). General instructions  Schedule regular health, dental, and eye exams.  Stay current with your vaccines.  Tell your health care provider if: ? You often feel depressed. ? You have ever been abused or do not feel safe at home. Summary  Adopting a healthy lifestyle and getting preventive care are important in promoting health and wellness.  Follow your health care provider's instructions about healthy  diet, exercising, and getting tested or screened for diseases.  Follow your health care provider's instructions on monitoring your cholesterol and blood pressure. This information is not intended to replace advice given to you by your health care provider. Make sure you discuss any questions you have with your health care provider. Document Revised: 02/10/2018 Document Reviewed: 02/10/2018 Elsevier Patient Education  2021 [employer].  

## 2020-06-20 LAB — HIV ANTIBODY (ROUTINE TESTING W REFLEX): HIV 1&2 Ab, 4th Generation: NONREACTIVE

## 2020-06-20 LAB — HEPATITIS C ANTIBODY
Hepatitis C Ab: NONREACTIVE
SIGNAL TO CUT-OFF: 0.01 (ref <1.00)

## 2020-06-20 NOTE — Telephone Encounter (Signed)
Pt received Prolia inj 06/14/20.   Next inj due 12/15/20.

## 2020-06-26 ENCOUNTER — Other Ambulatory Visit (HOSPITAL_COMMUNITY): Payer: Self-pay

## 2020-06-26 MED FILL — Simvastatin Tab 40 MG: ORAL | 30 days supply | Qty: 30 | Fill #0 | Status: AC

## 2020-07-02 ENCOUNTER — Other Ambulatory Visit (HOSPITAL_COMMUNITY): Payer: Self-pay

## 2020-07-02 ENCOUNTER — Other Ambulatory Visit: Payer: Self-pay | Admitting: Endocrinology

## 2020-07-02 MED ORDER — ALENDRONATE SODIUM 70 MG PO TABS
70.0000 mg | ORAL_TABLET | ORAL | 0 refills | Status: DC
  Filled 2020-07-02: qty 8, 56d supply, fill #0

## 2020-07-03 ENCOUNTER — Other Ambulatory Visit (HOSPITAL_COMMUNITY): Payer: Self-pay

## 2020-07-23 ENCOUNTER — Other Ambulatory Visit (HOSPITAL_COMMUNITY): Payer: Self-pay

## 2020-07-23 ENCOUNTER — Other Ambulatory Visit: Payer: Self-pay | Admitting: Internal Medicine

## 2020-07-23 MED ORDER — SIMVASTATIN 40 MG PO TABS
40.0000 mg | ORAL_TABLET | Freq: Every day | ORAL | 1 refills | Status: DC
  Filled 2020-07-23 – 2020-10-23 (×2): qty 90, 90d supply, fill #0
  Filled 2021-01-22 – 2021-02-01 (×2): qty 90, 90d supply, fill #1

## 2020-07-24 ENCOUNTER — Other Ambulatory Visit (HOSPITAL_COMMUNITY): Payer: Self-pay

## 2020-08-02 ENCOUNTER — Other Ambulatory Visit (HOSPITAL_COMMUNITY): Payer: Self-pay

## 2020-08-02 ENCOUNTER — Other Ambulatory Visit: Payer: Self-pay

## 2020-08-02 ENCOUNTER — Ambulatory Visit: Payer: 59 | Admitting: Endocrinology

## 2020-08-02 VITALS — BP 140/78 | HR 93 | Ht 60.0 in | Wt 135.0 lb

## 2020-08-02 DIAGNOSIS — M810 Age-related osteoporosis without current pathological fracture: Secondary | ICD-10-CM | POA: Diagnosis not present

## 2020-08-02 MED ORDER — TRIAMCINOLONE ACETONIDE 0.1 % EX CREA
1.0000 "application " | TOPICAL_CREAM | Freq: Three times a day (TID) | CUTANEOUS | 1 refills | Status: DC
  Filled 2020-08-02: qty 80, 30d supply, fill #0

## 2020-08-02 NOTE — Patient Instructions (Addendum)
Please continue the same Fosamax, Prolia, Vitamin-D, and calcium.   Please call [phone] to schedule a Bone Density (DexaScan) a the [place] office at [facility].  I have sent a prescription to your pharmacy, for a cream for the rash.   Please come back for a follow-up appointment in 1 year.

## 2020-08-02 NOTE — Progress Notes (Signed)
Subjective:    Patient ID: Angela Lam, female    DOB: [birth year], 60 y.o.   MRN: [id number]  HPI Pt returns for f/u of osteoporosis: Dx'ed: 2016 Secondary cause: none Fractures: left little toe (2018--traumatic) Past rx: Actonel 2019-2020[place] 2020-2021 Current rx: Fosamax since 2020, and Prolia since 2022 Last DEXA result: (2021). Worst T-score was -2.4 (RFN) Other: none Interval hx: pt states she feels well in general.  pt states she feels well in general, except for itchy rash on the upper arms.  She is unable to cite precip factor.   Past Medical History:  Diagnosis Date  . Blood in stool   . Chicken pox   . Depression   . Hyperlipidemia     Past Surgical History:  Procedure Laterality Date  . BREAST BIOPSY      Social History   Socioeconomic History  . Marital status: Married    Spouse name: Not on file  . Number of children: Not on file  . Years of education: Not on file  . Highest education level: Not on file  Occupational History  . Not on file  Tobacco Use  . Smoking status: Never Smoker  . Smokeless tobacco: Never Used  Substance and Sexual Activity  . Alcohol use: Yes    Comment: social drinker  . Drug use: Never  . Sexual activity: Yes    Partners: Male  Other Topics Concern  . Not on file  Social History Narrative  . Not on file   Social Determinants of Health   Financial Resource Strain: Not on file  Food Insecurity: Not on file  Transportation Needs: Not on file  Physical Activity: Not on file  Stress: Not on file  Social Connections: Not on file  Intimate Partner Violence: Not on file    Current Outpatient Medications on File Prior to Visit  Medication Sig Dispense Refill  . alendronate (FOSAMAX) 70 MG tablet Take 1 tablet (70 mg total) by mouth once a week before first meal or beverage of the day. 8 tablet 0  . aspirin EC 81 MG tablet Take 81 mg by mouth every other day.    . Calcium Carbonate-Vitamin D 600-400 MG-UNIT  tablet     . citalopram (CELEXA) 20 MG tablet TAKE 1 TABLET (20 MG) BY MOUTH IN THE MORNING. 90 tablet 0  . denosumab (PROLIA) 60 MG/ML SOSY injection Inject 60 mg into the skin every 6 (six) months.    . simvastatin (ZOCOR) 40 MG tablet Take 1 tablet (40 mg total) by mouth at bedtime. 90 tablet 1   No current facility-administered medications on file prior to visit.    No Known Allergies  Family History  Problem Relation Age of Onset  . Osteoporosis Maternal Grandmother   . Asthma Mother   . Cancer Mother   . COPD Mother   . Diabetes Mother   . Early death Mother   . Heart disease Mother   . Hyperlipidemia Mother   . Kidney disease Mother   . Miscarriages / [place] Mother   . Stroke Mother   . Heart attack Mother   . Cancer Father   . Heart disease Father   . Arthritis Sister   . Asthma Sister   . COPD Sister   . Depression Sister   . Diabetes Sister   . Heart disease Sister   . Hyperlipidemia Sister   . Hypertension Sister   . Heart attack Sister     BP 140/78 (BP  Location: Right Arm, Patient Position: Sitting, Cuff Size: Normal)   Pulse 93   Ht 5' (1.524 m)   Wt 135 lb (61.2 kg)   SpO2 96%   BMI 26.37 kg/m    Review of Systems Denies fever.      Objective:   Physical Exam VITAL SIGNS:  See vs page.   GENERAL: no distress.   SKIN: moderate patchy rash on the medial aspects of the upper arms.    Lab Results  Component Value Date   TSH 1.12 06/19/2020      Assessment & Plan:  Osteoporosis: due for recheck Rash, new,uncertain etiology.   Patient Instructions  Please continue the same Fosamax, Prolia, Vitamin-D, and calcium.   Please call [phone] to schedule a Bone Density (DexaScan) a the [place] office at [facility].  I have sent a prescription to your pharmacy, for a cream for the rash.   Please come back for a follow-up appointment in 1 year.

## 2020-08-03 ENCOUNTER — Other Ambulatory Visit: Payer: Self-pay

## 2020-08-03 ENCOUNTER — Emergency Department
Admission: EM | Admit: 2020-08-03 | Discharge: 2020-08-03 | Disposition: A | Discharge status: Home / Self Care | Payer: 59 | Source: Home / Self Care

## 2020-08-03 ENCOUNTER — Emergency Department: Admit: 2020-08-03 | Discharge status: Absent without leave | Payer: Self-pay

## 2020-08-03 DIAGNOSIS — R21 Rash and other nonspecific skin eruption: Secondary | ICD-10-CM | POA: Diagnosis not present

## 2020-08-03 MED ORDER — CYPROHEPTADINE HCL 4 MG PO TABS: 4.0000 mg | ORAL_TABLET | Freq: Three times a day (TID) | ORAL | 0 refills | Status: DC | PRN

## 2020-08-03 NOTE — ED Triage Notes (Signed)
Rash x 5-7 days. Mostly RT upper arm. A few spots located on hands and under LT breast. Itchy. Triamcinolone with no relief. No changes to detergents or lotions.

## 2020-08-03 NOTE — ED Provider Notes (Signed)
[name]    CSN: [id number] Arrival date & time: 08/03/20  1802      History   Chief Complaint Chief Complaint  Patient presents with  . Rash    HPI Angela Lam is a 60 y.o. female who presents with rash onset 5-7 days. Started on her R upper arm and is moving down to her thumb. Today she found a blister on medial L middle finger and L abdomen. She had Prolia  Shot in April on her R arm and one of the side effects is papular or blistery itchy rash, but she had her FU with endocrinology for her first Prolia shot this week and he did not think if was from that. He prescribed her Triamcinolone  0.1 % cream but only used it a couple of times. Last night the itching woke her up. Most of them are on R upper arm only a few spots present on hands and under L breast. The rash is itchy and not painful.  She  Denies use of any thing new topically or for washing. Has had shingles twice and also had the shingles shot.     Past Medical History:  Diagnosis Date  . Blood in stool   . Chicken pox   . Depression   . Hyperlipidemia     Patient Active Problem List   Diagnosis Date Noted  . Routine general medical examination at a health care facility 06/19/2020  . Polyp of colon 06/19/2020  . Visit for screening mammogram 06/19/2020  . Hyperlipidemia with target LDL less than 130 03/21/2020  . Osteoporosis 02/09/2020    Past Surgical History:  Procedure Laterality Date  . BREAST BIOPSY      OB History   No obstetric history on file.      Home Medications    Prior to Admission medications   Medication Sig Start Date End Date Taking? Authorizing Provider  cyproheptadine (PERIACTIN) 4 MG tablet Take 1 tablet (4 mg total) by mouth 3 (three) times daily as needed for allergies. And itching 08/03/20  Yes Rodriguez-Southworth, Sunday Spillers, PA-C  alendronate (FOSAMAX) 70 MG tablet Take 1 tablet (70 mg total) by mouth once a week before first meal or beverage of the day. 07/02/20    Renato Shin, MD  aspirin EC 81 MG tablet Take 81 mg by mouth every other day.    [provider]  Calcium Carbonate-Vitamin D 600-400 MG-UNIT tablet     [provider]  citalopram (CELEXA) 20 MG tablet TAKE 1 TABLET (20 MG) BY MOUTH IN THE MORNING. 06/04/20 06/04/21  Janith Lima, MD  denosumab (PROLIA) 60 MG/ML SOSY injection Inject 60 mg into the skin every 6 (six) months.    [provider]  simvastatin (ZOCOR) 40 MG tablet Take 1 tablet (40 mg total) by mouth at bedtime. 07/23/20   Janith Lima, MD  triamcinolone cream (KENALOG) 0.1 % Apply 1 application topically 3 (three) times daily as needed for itching or rash. 08/02/20   Renato Shin, MD    Family History Family History  Problem Relation Age of Onset  . Osteoporosis Maternal Grandmother   . Asthma Mother   . Cancer Mother   . COPD Mother   . Diabetes Mother   . Early death Mother   . Heart disease Mother   . Hyperlipidemia Mother   . Kidney disease Mother   . Miscarriages / [place] Mother   . Stroke Mother   . Heart attack Mother   .  Cancer Father   . Heart disease Father   . Arthritis Sister   . Asthma Sister   . COPD Sister   . Depression Sister   . Diabetes Sister   . Heart disease Sister   . Hyperlipidemia Sister   . Hypertension Sister   . Heart attack Sister     Social History Social History   Tobacco Use  . Smoking status: Never Smoker  . Smokeless tobacco: Never Used  Substance Use Topics  . Alcohol use: Yes    Comment: social drinker  . Drug use: Never     Allergies   Patient has no known allergies.   Review of Systems Review of Systems  Skin: Positive for rash.       + itching     Physical Exam Triage Vital Signs ED Triage Vitals  Enc Vitals Group     BP 08/03/20 1813 131/81     Pulse Rate 08/03/20 1813 82     Resp 08/03/20 1813 18     Temp 08/03/20 1813 98.2 F (36.8 C)     Temp Source 08/03/20 1813 Oral     SpO2 08/03/20 1813 97 %      Weight --      Height --      Head Circumference --      Peak Flow --      Pain Score 08/03/20 1814 0     Pain Loc --      Pain Edu? --      Excl. in [place]? --    No data found.  Updated Vital Signs BP 131/81 (BP Location: Left Arm)   Pulse 82   Temp 98.2 F (36.8 C) (Oral)   Resp 18   SpO2 97%   Visual Acuity Right Eye Distance:   Left Eye Distance:   Bilateral Distance:    Right Eye Near:   Left Eye Near:    Bilateral Near:     Physical Exam Vitals and nursing note reviewed.  Constitutional:      General: She is not in acute distress.    Appearance: She is not toxic-appearing.  HENT:     Head: Normocephalic.     Right Ear: External ear normal.     Left Ear: External ear normal.  Eyes:     General: No scleral icterus.    Conjunctiva/sclera: Conjunctivae normal.  Pulmonary:     Effort: Pulmonary effort is normal.  Musculoskeletal:     Cervical back: Neck supple.  Skin:    General: Skin is warm and dry.     Findings: Rash present.     Comments: Has pink to mild erythematous papules on R upper arm, more confluent on inner R upper arm and more scattered as it goes down her forearm and to her thumb area. There are a few that looks blistery. Has one on medial L middle finger that looks like a blister. The one on her L abdomen looks flat and red, no papules present here.   Neurological:     Mental Status: She is alert and oriented to person, place, and time.     Gait: Gait normal.  Psychiatric:        Mood and Affect: Mood normal.        Behavior: Behavior normal.        Thought Content: Thought content normal.        Judgment: Judgment normal.      UC Treatments / Results  Labs (  all labs ordered are listed, but only abnormal results are displayed) Labs Reviewed - No data to display  EKG   Radiology No results found.  Procedures Procedures (including critical care time)  Medications Ordered in UC Medications - No data to display  Initial Impression  / Assessment and Plan / UC Course  I have reviewed the triage vital signs and the nursing notes. I researched the description of the Prolia rash, and could not find images, but the wording sounds like her rash. I advised her to continue the Triamcinolone cream as prescribed for 7 days and I placed her on Periactin as noted for itching.  Final Clinical Impressions(s) / UC Diagnoses   Final diagnoses:  Rash and nonspecific skin eruption     Discharge Instructions     Continue the cream for 7 days. Follow up with your PCP if this does not resolve    ED Prescriptions    Medication Sig Dispense Auth. Provider   cyproheptadine (PERIACTIN) 4 MG tablet Take 1 tablet (4 mg total) by mouth 3 (three) times daily as needed for allergies. And itching 30 tablet Rodriguez-Southworth, Sunday Spillers, PA-C     PDMP not reviewed this encounter.   Shelby Mattocks, [place] 08/03/20 1844

## 2020-08-03 NOTE — Discharge Instructions (Addendum)
Continue the cream for 7 days. Follow up with your PCP if this does not resolve

## 2020-08-05 ENCOUNTER — Other Ambulatory Visit: Payer: Self-pay

## 2020-08-05 ENCOUNTER — Emergency Department
Admission: RE | Admit: 2020-08-05 | Discharge: 2020-08-05 | Disposition: A | Discharge status: Home / Self Care | Payer: 59 | Source: Ambulatory Visit

## 2020-08-05 VITALS — BP 114/73 | HR 91 | Temp 97.9°F | Resp 18 | Ht 60.0 in | Wt 130.0 lb

## 2020-08-05 DIAGNOSIS — R21 Rash and other nonspecific skin eruption: Secondary | ICD-10-CM | POA: Diagnosis not present

## 2020-08-05 MED ORDER — SARNA 0.5-0.5 % EX LOTN: 1.0000 "application " | TOPICAL_LOTION | CUTANEOUS | 0 refills | Status: DC | PRN

## 2020-08-05 MED ORDER — HYDROXYZINE HCL 25 MG PO TABS: 25.0000 mg | ORAL_TABLET | Freq: Four times a day (QID) | ORAL | 0 refills | Status: DC

## 2020-08-05 NOTE — ED Provider Notes (Signed)
[name]    CSN: [id number] Arrival date & time: 08/05/20  1341      History   Chief Complaint Chief Complaint  Patient presents with  . Rash    HPI Angela Lam is a 60 y.o. female.   Rash is worse now than when seen several days ago.  Some of the initial lesions which were blisterlike are now painful and there are now some urticarial lesions on her trunk as well as extremities.  There are no systemic symptoms such as fever cough etc.  HPI  Past Medical History:  Diagnosis Date  . Blood in stool   . Chicken pox   . Depression   . Hyperlipidemia     Patient Active Problem List   Diagnosis Date Noted  . Routine general medical examination at a health care facility 06/19/2020  . Polyp of colon 06/19/2020  . Visit for screening mammogram 06/19/2020  . Hyperlipidemia with target LDL less than 130 03/21/2020  . Osteoporosis 02/09/2020    Past Surgical History:  Procedure Laterality Date  . BREAST BIOPSY      OB History   No obstetric history on file.      Home Medications    Prior to Admission medications   Medication Sig Start Date End Date Taking? Authorizing Provider  camphor-menthol Timoteo Ace) lotion Apply 1 application topically as needed for itching. 08/05/20  Yes Wardell Honour, MD  hydrOXYzine (ATARAX/VISTARIL) 25 MG tablet Take 1 tablet (25 mg total) by mouth every 6 (six) hours. 08/05/20  Yes Wardell Honour, MD  alendronate (FOSAMAX) 70 MG tablet Take 1 tablet (70 mg total) by mouth once a week before first meal or beverage of the day. 07/02/20   Renato Shin, MD  aspirin EC 81 MG tablet Take 81 mg by mouth every other day.    [provider]  Calcium Carbonate-Vitamin D 600-400 MG-UNIT tablet     [provider]  citalopram (CELEXA) 20 MG tablet TAKE 1 TABLET (20 MG) BY MOUTH IN THE MORNING. 06/04/20 06/04/21  Janith Lima, MD  cyproheptadine (PERIACTIN) 4 MG tablet Take 1 tablet (4 mg total) by mouth 3 (three) times  daily as needed for allergies. And itching 08/03/20   Rodriguez-Southworth, Sunday Spillers, PA-C  denosumab (PROLIA) 60 MG/ML SOSY injection Inject 60 mg into the skin every 6 (six) months.    [provider]  simvastatin (ZOCOR) 40 MG tablet Take 1 tablet (40 mg total) by mouth at bedtime. 07/23/20   Janith Lima, MD  triamcinolone cream (KENALOG) 0.1 % Apply 1 application topically 3 (three) times daily as needed for itching or rash. 08/02/20   Renato Shin, MD    Family History Family History  Problem Relation Age of Onset  . Osteoporosis Maternal Grandmother   . Asthma Mother   . Cancer Mother   . COPD Mother   . Diabetes Mother   . Early death Mother   . Heart disease Mother   . Hyperlipidemia Mother   . Kidney disease Mother   . Miscarriages / [place] Mother   . Stroke Mother   . Heart attack Mother   . Cancer Father   . Heart disease Father   . Arthritis Sister   . Asthma Sister   . COPD Sister   . Depression Sister   . Diabetes Sister   . Heart disease Sister   . Hyperlipidemia Sister   . Hypertension Sister   . Heart attack Sister  Social History Social History   Tobacco Use  . Smoking status: Never Smoker  . Smokeless tobacco: Never Used  Vaping Use  . Vaping Use: Never used  Substance Use Topics  . Alcohol use: Yes    Comment: social drinker  . Drug use: Never     Allergies   Patient has no known allergies.   Review of Systems Review of Systems  Skin: Positive for rash.  All other systems reviewed and are negative.    Physical Exam Triage Vital Signs ED Triage Vitals  Enc Vitals Group     BP 08/05/20 1358 114/73     Pulse Rate 08/05/20 1358 91     Resp 08/05/20 1358 18     Temp 08/05/20 1358 97.9 F (36.6 C)     Temp Source 08/05/20 1358 Oral     SpO2 08/05/20 1358 96 %     Weight 08/05/20 1355 130 lb (59 kg)     Height 08/05/20 1355 5' (1.524 m)     Head Circumference --      Peak Flow --      Pain Score 08/05/20 1354 7      Pain Loc --      Pain Edu? --      Excl. in [place]? --    No data found.  Updated Vital Signs BP 114/73 (BP Location: Right Arm)   Pulse 91   Temp 97.9 F (36.6 C) (Oral)   Resp 18   Ht 5' (1.524 m)   Wt 59 kg   SpO2 96%   BMI 25.39 kg/m   Visual Acuity Right Eye Distance:   Left Eye Distance:   Bilateral Distance:    Right Eye Near:   Left Eye Near:    Bilateral Near:     Physical Exam Vitals and nursing note reviewed.  Constitutional:      Appearance: Normal appearance.  Cardiovascular:     Rate and Rhythm: Normal rate.  Pulmonary:     Effort: Pulmonary effort is normal.  Skin:    Comments: Several tight blisters mostly on her hands and fingers other macular papular lesions on extremities and trunk as well as some urticarial lesions as well.  Neurological:     Mental Status: She is alert.      UC Treatments / Results  Labs (all labs ordered are listed, but only abnormal results are displayed) Labs Reviewed - No data to display  EKG   Radiology No results found.  Procedures Procedures (including critical care time)  Medications Ordered in UC Medications - No data to display  Initial Impression / Assessment and Plan / UC Course  I have reviewed the triage vital signs and the nursing notes.  Pertinent labs & imaging results that were available during my care of the patient were reviewed by me and considered in my medical decision making (see chart for details).     Rash of uncertain etiology.  Do not think this represents a true bullous problem.  To give her symptomatic relief I have ordered Atarax to be used  with Periactin.  Also add Sarna lotion (menthol) for more relief.  I have urged her to seek consultation with dermatologist ASAP Final Clinical Impressions(s) / UC Diagnoses   Final diagnoses:  Rash   Discharge Instructions   None    ED Prescriptions    Medication Sig Dispense Auth. Provider   hydrOXYzine (ATARAX/VISTARIL) 25 MG tablet  Take 1 tablet (25 mg total) by mouth  every 6 (six) hours. 12 tablet Wardell Honour, MD   camphor-menthol [facility]) lotion Apply 1 application topically as needed for itching. 222 mL Wardell Honour, MD     PDMP not reviewed this encounter.   Wardell Honour, MD 08/05/20 [phone]

## 2020-08-05 NOTE — ED Triage Notes (Signed)
Pt presents to Urgent Care with c/o widespread rash. She was seen here 2 days ago, but since then, the rash has spread significantly and she wants to know what is causing it.

## 2020-08-06 ENCOUNTER — Other Ambulatory Visit: Payer: Self-pay | Admitting: Internal Medicine

## 2020-08-06 ENCOUNTER — Telehealth: Payer: Self-pay | Admitting: Dermatology

## 2020-08-06 ENCOUNTER — Telehealth: Payer: Self-pay | Admitting: Internal Medicine

## 2020-08-06 DIAGNOSIS — R21 Rash and other nonspecific skin eruption: Secondary | ICD-10-CM | POA: Insufficient documentation

## 2020-08-06 NOTE — Telephone Encounter (Signed)
Notes documented in referral and routed it back to referring office

## 2020-08-06 NOTE — Telephone Encounter (Signed)
Patient called and said that [facility] cannot see her until December for a new patient appointment. She was wondering if she could be referred to another dermatologist. Please advise

## 2020-08-06 NOTE — Telephone Encounter (Signed)
Patient said that she went to [facility] yesterday and Friday. She said that she has a rash all over and it is itchy and painful. She is requesting an emergent referral to a dermatologist. Please advise

## 2020-08-06 NOTE — Telephone Encounter (Signed)
Referral from Dr Ronnald Ramp can't wait until December. Please request referrer try to get her in elsewhere sooner

## 2020-08-10 ENCOUNTER — Encounter: Payer: Self-pay | Admitting: Internal Medicine

## 2020-08-17 NOTE — Telephone Encounter (Signed)
Angela Lam from [place] is calling to speak with Theadora Rama

## 2020-08-17 NOTE — Telephone Encounter (Signed)
ext [phone]

## 2020-08-17 NOTE — Telephone Encounter (Signed)
Vincent from

## 2020-08-17 NOTE — Telephone Encounter (Signed)
[phone]  

## 2020-08-21 ENCOUNTER — Other Ambulatory Visit (HOSPITAL_COMMUNITY): Payer: Self-pay

## 2020-08-23 ENCOUNTER — Other Ambulatory Visit (HOSPITAL_COMMUNITY): Payer: Self-pay

## 2020-08-27 ENCOUNTER — Other Ambulatory Visit: Payer: Self-pay | Admitting: Endocrinology

## 2020-08-27 ENCOUNTER — Other Ambulatory Visit (HOSPITAL_COMMUNITY): Payer: Self-pay

## 2020-08-27 MED ORDER — ALENDRONATE SODIUM 70 MG PO TABS
70.0000 mg | ORAL_TABLET | ORAL | 0 refills | Status: DC
  Filled 2020-08-27: qty 8, 56d supply, fill #0

## 2020-08-28 ENCOUNTER — Other Ambulatory Visit (HOSPITAL_COMMUNITY): Payer: Self-pay

## 2020-08-30 ENCOUNTER — Other Ambulatory Visit (HOSPITAL_COMMUNITY): Payer: Self-pay

## 2020-08-31 ENCOUNTER — Other Ambulatory Visit (HOSPITAL_COMMUNITY): Payer: Self-pay

## 2020-09-04 ENCOUNTER — Other Ambulatory Visit: Payer: Self-pay | Admitting: Internal Medicine

## 2020-09-04 ENCOUNTER — Other Ambulatory Visit (HOSPITAL_COMMUNITY): Payer: Self-pay

## 2020-09-04 MED ORDER — CITALOPRAM HYDROBROMIDE 20 MG PO TABS
20.0000 mg | ORAL_TABLET | Freq: Every morning | ORAL | 1 refills | Status: DC
  Filled 2020-09-04: qty 90, 90d supply, fill #0
  Filled 2020-11-29: qty 90, 90d supply, fill #1

## 2020-09-05 NOTE — Telephone Encounter (Signed)
  Hi Angela Lam,  Pt should not have been billed for Prolia at time of service if she purchased Prolia from the pharmacy and brought it in with her to her appointment.   Message me if you have any questions regarding documentation.

## 2020-09-05 NOTE — Telephone Encounter (Signed)
Patient came in today to advise that she has received a bill for her Prolia.  Has contacted [facility] billing, [place] [employer], etc and is not getting a resolution.    She was told that since she went through [facility] pharmacy and they used a co-pay assistance type card/program so there would be no charge.    She left copy of bill and is asking for assistance will bill of $2457.49.  I will upload this to media file.    Call back number is [phone]

## 2020-09-06 ENCOUNTER — Encounter: Payer: Self-pay | Admitting: Endocrinology

## 2020-09-07 ENCOUNTER — Other Ambulatory Visit (HOSPITAL_COMMUNITY): Payer: Self-pay

## 2020-09-10 NOTE — Telephone Encounter (Signed)
This has been taking care of by [facility]

## 2020-10-15 ENCOUNTER — Other Ambulatory Visit: Payer: Self-pay | Admitting: Endocrinology

## 2020-10-15 ENCOUNTER — Other Ambulatory Visit (HOSPITAL_COMMUNITY): Payer: Self-pay

## 2020-10-16 ENCOUNTER — Other Ambulatory Visit: Payer: Self-pay | Admitting: Endocrinology

## 2020-10-16 ENCOUNTER — Other Ambulatory Visit (HOSPITAL_COMMUNITY): Payer: Self-pay

## 2020-10-16 MED ORDER — ALENDRONATE SODIUM 70 MG PO TABS
70.0000 mg | ORAL_TABLET | ORAL | 3 refills | Status: DC
  Filled 2020-10-16: qty 8, 56d supply, fill #0
  Filled 2020-12-17: qty 8, 56d supply, fill #1
  Filled 2021-02-11: qty 8, 56d supply, fill #2
  Filled 2021-04-05: qty 8, 56d supply, fill #3

## 2020-10-17 ENCOUNTER — Other Ambulatory Visit (HOSPITAL_COMMUNITY): Payer: Self-pay

## 2020-10-23 ENCOUNTER — Other Ambulatory Visit (HOSPITAL_COMMUNITY): Payer: Self-pay

## 2020-11-01 NOTE — Telephone Encounter (Signed)
Prolia VOB initiated via [email or web address]  Last OV: 08/02/20 Next OV: 08/02/21 Last Prolia inj: 06/14/20 Next Prolia inj DUE: 12/15/20

## 2020-11-06 NOTE — Telephone Encounter (Signed)
Prolia Copay Assistance Card  PA required

## 2020-11-09 NOTE — Telephone Encounter (Addendum)
  PA initiated via [facility] Provider Portal.  Case ID# [id number]

## 2020-11-14 NOTE — Telephone Encounter (Signed)
Double checking coverage. Last VOB has OOP at $60. This VOB has OOP cost at 40%.

## 2020-11-19 NOTE — Telephone Encounter (Signed)
Pt ready for scheduling on or after 12/15/20 Rx needs to be sent to pharmacy  Out-of-pocket cost due at time of visit: $$559 Prolia Copay Assistance Card (pt to use at pharmacy when picking up Rx)  Primary: [employer] Prolia co-insurance: (40% $509) Admin fee co-insurance: 40% ($50)  Secondary: n/a Prolia co-insurance:  Admin fee co-insurance:   Deductible: does not apply  Prior Auth: APPROVED PA# [id number] Valid: 06/01/20-05/31/21    ** This summary of benefits is an estimation of the patient's out-of-pocket cost. Exact cost may very based on individual plan coverage.

## 2020-11-20 ENCOUNTER — Encounter: Payer: Self-pay | Admitting: Internal Medicine

## 2020-11-20 ENCOUNTER — Other Ambulatory Visit: Payer: Self-pay | Admitting: Internal Medicine

## 2020-11-20 DIAGNOSIS — Z1231 Encounter for screening mammogram for malignant neoplasm of breast: Secondary | ICD-10-CM

## 2020-11-22 ENCOUNTER — Other Ambulatory Visit (HOSPITAL_COMMUNITY): Payer: Self-pay

## 2020-11-26 ENCOUNTER — Other Ambulatory Visit: Payer: Self-pay | Admitting: Endocrinology

## 2020-11-26 ENCOUNTER — Other Ambulatory Visit (HOSPITAL_COMMUNITY): Payer: Self-pay

## 2020-11-27 ENCOUNTER — Other Ambulatory Visit (HOSPITAL_COMMUNITY): Payer: Self-pay

## 2020-11-27 NOTE — Telephone Encounter (Signed)
Hi Mardene Celeste,  Do you mind calling pt to confirm if she wants Rx for Prolia sent to [facility] or [facility] (which every pharmacy she picked it up from last time.   Thanks!

## 2020-11-28 ENCOUNTER — Other Ambulatory Visit: Payer: Self-pay | Admitting: Endocrinology

## 2020-11-28 ENCOUNTER — Other Ambulatory Visit (HOSPITAL_COMMUNITY): Payer: Self-pay

## 2020-11-29 ENCOUNTER — Other Ambulatory Visit (HOSPITAL_COMMUNITY): Payer: Self-pay

## 2020-11-29 NOTE — Telephone Encounter (Signed)
Per patient send to [facility] and per patient [facility] will courier the Prolia to our office  ???

## 2020-11-30 ENCOUNTER — Other Ambulatory Visit (HOSPITAL_COMMUNITY): Payer: Self-pay

## 2020-12-02 ENCOUNTER — Other Ambulatory Visit: Payer: Self-pay

## 2020-12-02 MED FILL — Denosumab Inj Soln Prefilled Syringe 60 MG/ML: SUBCUTANEOUS | Qty: 1 | Fill #0 | Status: CN

## 2020-12-03 ENCOUNTER — Other Ambulatory Visit: Payer: Self-pay

## 2020-12-03 ENCOUNTER — Other Ambulatory Visit: Payer: Self-pay | Admitting: Pharmacist

## 2020-12-03 ENCOUNTER — Other Ambulatory Visit (HOSPITAL_COMMUNITY): Payer: Self-pay

## 2020-12-03 MED ORDER — PROLIA 60 MG/ML ~~LOC~~ SOSY
PREFILLED_SYRINGE | SUBCUTANEOUS | 1 refills | Status: DC
  Filled 2020-12-03: qty 1, fill #0

## 2020-12-03 MED ORDER — PROLIA 60 MG/ML ~~LOC~~ SOSY
PREFILLED_SYRINGE | SUBCUTANEOUS | 1 refills | Status: DC
  Filled 2020-12-03 – 2020-12-14 (×3): qty 1, 180d supply, fill #0
  Filled 2021-04-26: qty 1, 180d supply, fill #1

## 2020-12-03 MED ORDER — PROLIA 60 MG/ML ~~LOC~~ SOSY
PREFILLED_SYRINGE | SUBCUTANEOUS | 1 refills | Status: DC
  Filled 2020-12-03: qty 1, 180d supply, fill #0

## 2020-12-03 NOTE — Telephone Encounter (Signed)
Rx for Prolia sent to [facility].

## 2020-12-06 ENCOUNTER — Other Ambulatory Visit (HOSPITAL_COMMUNITY): Payer: Self-pay

## 2020-12-13 ENCOUNTER — Other Ambulatory Visit (HOSPITAL_COMMUNITY): Payer: Self-pay

## 2020-12-14 ENCOUNTER — Other Ambulatory Visit (HOSPITAL_COMMUNITY): Payer: Self-pay

## 2020-12-17 ENCOUNTER — Other Ambulatory Visit (HOSPITAL_COMMUNITY): Payer: Self-pay

## 2020-12-18 ENCOUNTER — Other Ambulatory Visit: Payer: Self-pay

## 2020-12-18 ENCOUNTER — Ambulatory Visit (INDEPENDENT_AMBULATORY_CARE_PROVIDER_SITE_OTHER): Payer: 59

## 2020-12-18 DIAGNOSIS — M810 Age-related osteoporosis without current pathological fracture: Secondary | ICD-10-CM

## 2020-12-18 MED ORDER — DENOSUMAB 60 MG/ML ~~LOC~~ SOSY
60.0000 mg | PREFILLED_SYRINGE | Freq: Once | SUBCUTANEOUS | Status: AC
  Administered 2020-12-18: 60 mg via SUBCUTANEOUS

## 2020-12-18 NOTE — Progress Notes (Signed)
Prolia injection administered to Right arm. Patient tolerated well. Informed to seek medical attention if she experiences any problems.

## 2020-12-30 NOTE — Telephone Encounter (Signed)
Last Prolia inj 12/18/20 Next Prolia inj due 06/19/21

## 2021-01-01 ENCOUNTER — Encounter: Payer: Self-pay | Admitting: Endocrinology

## 2021-01-01 ENCOUNTER — Encounter: Payer: Self-pay | Admitting: Internal Medicine

## 2021-01-02 ENCOUNTER — Other Ambulatory Visit: Payer: Self-pay | Admitting: Internal Medicine

## 2021-01-02 DIAGNOSIS — Z124 Encounter for screening for malignant neoplasm of cervix: Secondary | ICD-10-CM

## 2021-01-08 ENCOUNTER — Other Ambulatory Visit: Payer: Self-pay

## 2021-01-08 ENCOUNTER — Ambulatory Visit
Admission: RE | Admit: 2021-01-08 | Discharge: 2021-01-08 | Disposition: A | Discharge status: Home / Self Care | Payer: 59 | Source: Ambulatory Visit | Attending: Internal Medicine | Admitting: Internal Medicine

## 2021-01-08 DIAGNOSIS — Z1231 Encounter for screening mammogram for malignant neoplasm of breast: Secondary | ICD-10-CM | POA: Diagnosis not present

## 2021-01-15 ENCOUNTER — Other Ambulatory Visit (HOSPITAL_COMMUNITY): Payer: Self-pay

## 2021-01-17 ENCOUNTER — Ambulatory Visit (INDEPENDENT_AMBULATORY_CARE_PROVIDER_SITE_OTHER)
Admission: RE | Admit: 2021-01-17 | Discharge: 2021-01-17 | Disposition: A | Discharge status: Home / Self Care | Payer: 59 | Source: Ambulatory Visit | Attending: Endocrinology | Admitting: Endocrinology

## 2021-01-17 ENCOUNTER — Other Ambulatory Visit: Payer: Self-pay

## 2021-01-17 DIAGNOSIS — M810 Age-related osteoporosis without current pathological fracture: Secondary | ICD-10-CM | POA: Diagnosis not present

## 2021-01-22 ENCOUNTER — Other Ambulatory Visit (HOSPITAL_COMMUNITY): Payer: Self-pay

## 2021-01-28 DIAGNOSIS — Z20828 Contact with and (suspected) exposure to other viral communicable diseases: Secondary | ICD-10-CM | POA: Diagnosis not present

## 2021-01-30 ENCOUNTER — Encounter: Payer: Self-pay | Admitting: Emergency Medicine

## 2021-01-30 ENCOUNTER — Other Ambulatory Visit (HOSPITAL_COMMUNITY): Payer: Self-pay

## 2021-01-30 ENCOUNTER — Emergency Department
Admission: EM | Admit: 2021-01-30 | Discharge: 2021-01-30 | Disposition: A | Discharge status: Home / Self Care | Payer: 59 | Source: Home / Self Care | Attending: Family Medicine | Admitting: Family Medicine

## 2021-01-30 DIAGNOSIS — J029 Acute pharyngitis, unspecified: Secondary | ICD-10-CM

## 2021-01-30 LAB — POCT INFLUENZA A/B
Influenza A, POC: NEGATIVE
Influenza B, POC: NEGATIVE

## 2021-01-30 LAB — POCT RAPID STREP A (OFFICE): Rapid Strep A Screen: NEGATIVE

## 2021-01-30 MED ORDER — AMOXICILLIN 500 MG PO CAPS: 500.0000 mg | ORAL_CAPSULE | Freq: Two times a day (BID) | ORAL | 0 refills | Status: DC

## 2021-01-30 NOTE — Discharge Instructions (Addendum)
Influenza test is negative.  Rapid strep is negative.  Throat culture is sent to the lab Take the amoxicillin 2 times a day.  Take 2 doses today.  Check on MyChart for your throat culture report.  It should be available in 2 to 3 days.  If the throat culture is negative for strep, you will stop the antibiotic as soon as you feel well.  If your throat culture is positive for strep bacteria, you need to take 10 full days of antibiotics

## 2021-01-30 NOTE — ED Provider Notes (Signed)
Angela Lam CARE    CSN: [id number] Arrival date & time: 01/30/21  0857      History   Chief Complaint Chief Complaint  Patient presents with   Generalized Body Aches   Fever    HPI Angela Lam is a 60 y.o. female.   HPI Very painful sore throat for the last 4 days.  Fever to 101.9 at home.  Is taking Tylenol for fever and NyQuil for symptoms.  Mild runny nose.  Mild ear pain.  No coughing or chest congestion.  No nausea or vomiting No known exposure to illness Has had a flu shot, also has had her COVID vaccinations.  COVID test at home negative Past Medical History:  Diagnosis Date   Blood in stool    Chicken pox    Depression    Hyperlipidemia     Patient Active Problem List   Diagnosis Date Noted   Cervical cancer screening 01/02/2021   Routine general medical examination at a health care facility 06/19/2020   Polyp of colon 06/19/2020   Visit for screening mammogram 06/19/2020   Hyperlipidemia with target LDL less than 130 03/21/2020   Osteoporosis 02/09/2020    Past Surgical History:  Procedure Laterality Date   BREAST BIOPSY Right 2013   benign    OB History   No obstetric history on file.      Home Medications    Prior to Admission medications   Medication Sig Start Date End Date Taking? Authorizing Provider  amoxicillin (AMOXIL) 500 MG capsule Take 1 capsule (500 mg total) by mouth 2 (two) times daily. 01/30/21  Yes Raylene Everts, MD  alendronate (FOSAMAX) 70 MG tablet Take 1 tablet (70 mg total) by mouth once a week before first meal or beverage of the day. 10/16/20   Renato Shin, MD  aspirin EC 81 MG tablet Take 81 mg by mouth every other day.    [provider]  Calcium Carbonate-Vitamin D 600-400 MG-UNIT tablet     [provider]  citalopram (CELEXA) 20 MG tablet Take 1 tablet (20 mg total) by mouth in the morning. 09/04/20   Janith Lima, MD  denosumab (PROLIA) 60 MG/ML SOSY injection INJECT 60MG  INTO  THE SKIN EVERY 6 MONTHS. 12/03/20   Tresa Garter, MD  simvastatin (ZOCOR) 40 MG tablet Take 1 tablet (40 mg total) by mouth at bedtime. 07/23/20   Janith Lima, MD    Family History Family History  Problem Relation Age of Onset   Asthma Mother    Cancer Mother    COPD Mother    Diabetes Mother    Early death Mother    Heart disease Mother    Hyperlipidemia Mother    Kidney disease Mother    35 / [place] Mother    Stroke Mother    Heart attack Mother    Cancer Father    Heart disease Father    Arthritis Sister    Asthma Sister    COPD Sister    Depression Sister    Diabetes Sister    Heart disease Sister    Hyperlipidemia Sister    Hypertension Sister    Heart attack Sister    Osteoporosis Maternal Grandmother    Breast cancer Neg Hx     Social History Social History   Tobacco Use   Smoking status: Never   Smokeless tobacco: Never  Vaping Use   Vaping Use: Never used  Substance Use Topics   Alcohol use:  Yes    Comment: social drinker   Drug use: Never     Allergies   Patient has no known allergies.   Review of Systems Review of Systems See HPI  Physical Exam Triage Vital Signs ED Triage Vitals  Enc Vitals Group     BP 01/30/21 0932 132/80     Pulse Rate 01/30/21 0932 (!) 107     Resp 01/30/21 0932 16     Temp 01/30/21 0932 99.7 F (37.6 C)     Temp Source 01/30/21 0932 Oral     SpO2 01/30/21 0932 98 %     Weight 01/30/21 0936 130 lb (59 kg)     Height 01/30/21 0936 5' (1.524 m)     Head Circumference --      Peak Flow --      Pain Score 01/30/21 0936 4     Pain Loc --      Pain Edu? --      Excl. in [place]? --    No data found.  Updated Vital Signs BP 132/80 (BP Location: Left Arm)   Pulse (!) 107   Temp 99.7 F (37.6 C) (Oral)   Resp 16   Ht 5' (1.524 m)   Wt 59 kg   SpO2 98%   BMI 25.39 kg/m      Physical Exam Constitutional:      General: She is not in acute distress.    Appearance: She is  well-developed.  HENT:     Head: Normocephalic and atraumatic.     Right Ear: Tympanic membrane, ear canal and external ear normal.     Left Ear: Tympanic membrane, ear canal and external ear normal.     Nose: Rhinorrhea present.     Comments: Clear rhinorrhea    Mouth/Throat:     Mouth: Mucous membranes are moist.     Pharynx: Posterior oropharyngeal erythema present.     Comments: Tonsillar pillars deeply erythematous.  No exudate Eyes:     Conjunctiva/sclera: Conjunctivae normal.     Pupils: Pupils are equal, round, and reactive to light.  Cardiovascular:     Rate and Rhythm: Normal rate and regular rhythm.     Heart sounds: Normal heart sounds.  Pulmonary:     Effort: Pulmonary effort is normal. No respiratory distress.     Breath sounds: Normal breath sounds.  Abdominal:     General: There is no distension.     Palpations: Abdomen is soft.  Musculoskeletal:        General: Normal range of motion.     Cervical back: Normal range of motion.  Lymphadenopathy:     Cervical: Cervical adenopathy present.  Skin:    General: Skin is warm and dry.  Neurological:     Mental Status: She is alert.  Psychiatric:        Mood and Affect: Mood normal.        Behavior: Behavior normal.     UC Treatments / Results  Labs (all labs ordered are listed, but only abnormal results are displayed) Labs Reviewed  CULTURE, GROUP A STREP [facility])  POCT RAPID STREP A (OFFICE)  POCT INFLUENZA A/B    EKG   Radiology No results found.  Procedures Procedures (including critical care time)  Medications Ordered in UC Medications - No data to display  Initial Impression / Assessment and Plan / UC Course  I have reviewed the triage vital signs and the nursing notes.  Pertinent labs & imaging results  that were available during my care of the patient were reviewed by me and considered in my medical decision making (see chart for details).     Strep test and influenza test both negative.   Discussed this is likely a viral upper respiratory infection.  Because of the painful sore throat we will give her an antibiotic until the throat culture is available. Final Clinical Impressions(s) / UC Diagnoses   Final diagnoses:  Acute pharyngitis, unspecified etiology     Discharge Instructions      Influenza test is negative.  Rapid strep is negative.  Throat culture is sent to the lab Take the amoxicillin 2 times a day.  Take 2 doses today.  Check on MyChart for your throat culture report.  It should be available in 2 to 3 days.  If the throat culture is negative for strep, you will stop the antibiotic as soon as you feel well.  If your throat culture is positive for strep bacteria, you need to take 10 full days of antibiotics     ED Prescriptions     Medication Sig Dispense Auth. Provider   amoxicillin (AMOXIL) 500 MG capsule Take 1 capsule (500 mg total) by mouth 2 (two) times daily. 20 capsule Raylene Everts, MD      PDMP not reviewed this encounter.   Raylene Everts, MD 01/30/21 [phone]

## 2021-01-30 NOTE — ED Triage Notes (Signed)
Congestion, sore throat, body aches & fever since Sunday  Tmax at  home 101.9 HAW - COVID test negative Monday/home COVID test on Tuesday negative  OTC nyquil & tylenol - nothing today  Flu vaccine in Sept

## 2021-02-01 ENCOUNTER — Other Ambulatory Visit (HOSPITAL_COMMUNITY): Payer: Self-pay

## 2021-02-01 LAB — CULTURE, GROUP A STREP
MICRO NUMBER:: 12696034
SPECIMEN QUALITY:: ADEQUATE

## 2021-02-11 ENCOUNTER — Other Ambulatory Visit (HOSPITAL_COMMUNITY): Payer: Self-pay

## 2021-03-05 ENCOUNTER — Other Ambulatory Visit (HOSPITAL_COMMUNITY): Payer: Self-pay

## 2021-03-05 ENCOUNTER — Other Ambulatory Visit: Payer: Self-pay | Admitting: Internal Medicine

## 2021-03-05 MED ORDER — CITALOPRAM HYDROBROMIDE 20 MG PO TABS
20.0000 mg | ORAL_TABLET | Freq: Every morning | ORAL | 1 refills | Status: DC
  Filled 2021-03-05: qty 90, 90d supply, fill #0
  Filled 2021-05-30: qty 90, 90d supply, fill #1

## 2021-03-26 ENCOUNTER — Other Ambulatory Visit (HOSPITAL_COMMUNITY): Payer: Self-pay

## 2021-04-02 ENCOUNTER — Other Ambulatory Visit (HOSPITAL_COMMUNITY): Payer: Self-pay

## 2021-04-05 ENCOUNTER — Other Ambulatory Visit (HOSPITAL_COMMUNITY): Payer: Self-pay

## 2021-04-23 ENCOUNTER — Other Ambulatory Visit (HOSPITAL_COMMUNITY): Payer: Self-pay

## 2021-04-24 ENCOUNTER — Other Ambulatory Visit: Payer: Self-pay | Admitting: Internal Medicine

## 2021-04-24 ENCOUNTER — Other Ambulatory Visit (HOSPITAL_COMMUNITY): Payer: Self-pay

## 2021-04-24 MED ORDER — SIMVASTATIN 40 MG PO TABS
40.0000 mg | ORAL_TABLET | Freq: Every day | ORAL | 1 refills | Status: DC
  Filled 2021-04-24: qty 90, 90d supply, fill #0
  Filled 2021-07-23: qty 90, 90d supply, fill #1

## 2021-04-26 ENCOUNTER — Other Ambulatory Visit (HOSPITAL_COMMUNITY): Payer: Self-pay

## 2021-05-21 NOTE — Telephone Encounter (Signed)
Prolia VOB initiated via [email or web address] ? ?Last OV:  ?Next OV:  ?Last Prolia inj: 12/18/20 ?Next Prolia inj DUE: 06/19/21 ?

## 2021-05-27 NOTE — Telephone Encounter (Signed)
Prior auth required for Prolia ? ?PA PROCESS DETAILS: PA is required and is currently not on file. Please complete form at  ?[email or web address] ? ?

## 2021-05-30 ENCOUNTER — Other Ambulatory Visit: Payer: Self-pay | Admitting: Endocrinology

## 2021-05-30 ENCOUNTER — Other Ambulatory Visit (HOSPITAL_COMMUNITY): Payer: Self-pay

## 2021-05-31 ENCOUNTER — Other Ambulatory Visit (HOSPITAL_COMMUNITY): Payer: Self-pay

## 2021-05-31 MED ORDER — ALENDRONATE SODIUM 70 MG PO TABS
70.0000 mg | ORAL_TABLET | ORAL | 3 refills | Status: DC
  Filled 2021-05-31: qty 8, 56d supply, fill #0

## 2021-06-03 ENCOUNTER — Other Ambulatory Visit (HOSPITAL_COMMUNITY): Payer: Self-pay

## 2021-06-07 NOTE — Telephone Encounter (Signed)
Prior auth initiated via [email or web address] ?KEY: [id number] ? ? ? ?

## 2021-06-11 NOTE — Telephone Encounter (Signed)
Pt ready for scheduling on or after 06/19/21 ?Pt obtains from [place] ? ?Out-of-pocket cost due at time of visit: pays pharmacy at time of pick up ? ?Primary: UMR ?Prolia co-insurance: 40% ?Admin fee co-insurance: 40% ? ?Secondary: n/a ?Prolia co-insurance:  ?Admin fee co-insurance:  ? ?Deductible: $350 of $350 met ? ?Prior Auth: APPROVED  ?PA# [id number] ?Valid: 06/10/21-06/10/22 ?  ? ?** This summary of benefits is an estimation of the patient's out-of-pocket cost. Exact cost may very based on individual plan coverage.  ? ?

## 2021-06-11 NOTE — Telephone Encounter (Signed)
Prior auth APPROVED ?KEY/PA#: [id number] - PA Case ID: [id number] ? ? ?

## 2021-06-12 ENCOUNTER — Other Ambulatory Visit (HOSPITAL_COMMUNITY): Payer: Self-pay

## 2021-06-12 ENCOUNTER — Telehealth: Payer: Self-pay

## 2021-06-12 NOTE — Telephone Encounter (Signed)
Patient Advocate Encounter ? ?Prior Authorization for Prolia injection has been approved.   ? ?PA# [id number] ? ?Effective dates: 06/10/21 through 06/10/22 ? ?Per Test Claim Patients co-pay is $750.  ? ?Patient needs to enroll in financial assistance. ? ?Patient Advocate ?Fax: [phone]  ?

## 2021-06-13 ENCOUNTER — Other Ambulatory Visit (HOSPITAL_COMMUNITY): Payer: Self-pay

## 2021-06-17 ENCOUNTER — Other Ambulatory Visit (HOSPITAL_COMMUNITY): Payer: Self-pay

## 2021-06-19 ENCOUNTER — Encounter: Payer: Self-pay | Admitting: Endocrinology

## 2021-06-19 ENCOUNTER — Ambulatory Visit: Payer: 59 | Admitting: Endocrinology

## 2021-06-19 ENCOUNTER — Ambulatory Visit: Payer: 59 | Attending: Internal Medicine | Admitting: Pharmacist

## 2021-06-19 VITALS — BP 122/78 | Ht 60.0 in | Wt 138.0 lb

## 2021-06-19 DIAGNOSIS — Z79899 Other long term (current) drug therapy: Secondary | ICD-10-CM

## 2021-06-19 DIAGNOSIS — M810 Age-related osteoporosis without current pathological fracture: Secondary | ICD-10-CM | POA: Diagnosis not present

## 2021-06-19 MED ORDER — DENOSUMAB 60 MG/ML ~~LOC~~ SOSY
60.0000 mg | PREFILLED_SYRINGE | Freq: Once | SUBCUTANEOUS | Status: AC
  Administered 2021-06-19: 60 mg via SUBCUTANEOUS

## 2021-06-19 NOTE — Telephone Encounter (Signed)
Last Prolia inj 06/19/21 ?Next Prolia inj due 12/20/21 ?

## 2021-06-19 NOTE — Progress Notes (Signed)
- ? ?Subjective:  ? ? Patient ID: Angela Lam, female    DOB: 1960/05/12, 61 y.o.   MRN: [id number] ? ?HPI ?Pt returns for f/u of osteoporosis: ?Dx'ed: 2016 ?Secondary cause: none ?Fractures: left little toe (2018--traumatic) ?Past rx: Actonel 2019-2020; Tymlos 2020-2021.  ?Current rx: Fosamax since 2020, and Prolia since 2022.   ?Last DEXA result: (2022): worst T-score was -2.2 (RFN).   ?Other: none ?Interval hx: pt states she feels well in general.  She takes fosamax as rx'ed.  She takes Vit-D, uncertain dosage.   ?Past Medical History:  ?Diagnosis Date  ? Blood in stool   ? Chicken pox   ? Depression   ? Hyperlipidemia   ? ? ?Past Surgical History:  ?Procedure Laterality Date  ? BREAST BIOPSY Right 2013  ? benign  ? ? ?Social History  ? ?Socioeconomic History  ? Marital status: Married  ?  Spouse name: Not on file  ? Number of children: Not on file  ? Years of education: Not on file  ? Highest education level: Not on file  ?Occupational History  ? Not on file  ?Tobacco Use  ? Smoking status: Never  ? Smokeless tobacco: Never  ?Vaping Use  ? Vaping Use: Never used  ?Substance and Sexual Activity  ? Alcohol use: Yes  ?  Comment: social drinker  ? Drug use: Never  ? Sexual activity: Yes  ?  Partners: Male  ?Other Topics Concern  ? Not on file  ?Social History Narrative  ? Not on file  ? ?Social Determinants of Health  ? ?Financial Resource Strain: Not on file  ?Food Insecurity: Not on file  ?Transportation Needs: Not on file  ?Physical Activity: Not on file  ?Stress: Not on file  ?Social Connections: Not on file  ?Intimate Partner Violence: Not on file  ? ? ?Current Outpatient Medications on File Prior to Visit  ?Medication Sig Dispense Refill  ? alendronate (FOSAMAX) 70 MG tablet Take 1 tablet (70 mg total) by mouth once a week before first meal or beverage of the day. 8 tablet 3  ? aspirin EC 81 MG tablet Take 81 mg by mouth every other day.    ? Calcium Carbonate-Vitamin D 600-400 MG-UNIT tablet     ?  citalopram (CELEXA) 20 MG tablet Take 1 tablet (20 mg total) by mouth in the morning. 90 tablet 1  ? denosumab (PROLIA) 60 MG/ML SOSY injection INJECT '60MG'$  INTO THE SKIN EVERY 6 MONTHS. 1 mL 1  ? simvastatin (ZOCOR) 40 MG tablet Take 1 tablet (40 mg total) by mouth at bedtime. 90 tablet 1  ? ?No current facility-administered medications on file prior to visit.  ? ? ?No Known Allergies ? ?Family History  ?Problem Relation Age of Onset  ? Asthma Mother   ? Cancer Mother   ? COPD Mother   ? Diabetes Mother   ? Early death Mother   ? Heart disease Mother   ? Hyperlipidemia Mother   ? Kidney disease Mother   ? Miscarriages / [place] Mother   ? Stroke Mother   ? Heart attack Mother   ? Cancer Father   ? Heart disease Father   ? Arthritis Sister   ? Asthma Sister   ? COPD Sister   ? Depression Sister   ? Diabetes Sister   ? Heart disease Sister   ? Hyperlipidemia Sister   ? Hypertension Sister   ? Heart attack Sister   ? Osteoporosis Maternal Grandmother   ?  Breast cancer Neg Hx   ? ? ?BP 122/78   Ht 5' (1.524 m)   Wt 138 lb (62.6 kg)   BMI 26.95 kg/m?  ? ? ?Review of Systems ? ?   ?Objective:  ? Physical Exam ?VITAL SIGNS:  See vs page ?GENERAL: no distress ?GAIT: normal and steady.  ? ?25-OH Vit-D=27 ?   ?Assessment & Plan:  ?Osteoporosis, due for recheck.  ?Vit-D def: uncontrolled.  I advised pt to 2000 units per day.  ? ?Patient Instructions  ?Please continue the same Fosamax, Prolia, Vitamin-D, and calcium.  ?Blood tests are requested for you today.  We'll let you know about the results.   ?Please call [phone] to schedule a Bone Density (DexaScan) a the [place] office at [facility].  Please do this after 01/20/21.    ?Please come back for a follow-up appointment in 1 year.   ? ? ?

## 2021-06-19 NOTE — Progress Notes (Signed)
S:  ?Patient presents for review of their specialty medication therapy. ? ?Patient is on Prolia for osteoporosis. Patient is managed by Dr. Loanne Drilling for this. She had a BMD on 01/17/21. ? ?Adherence: confirms ? ?Efficacy: reports that Dr. Loanne Drilling is pleased with results based on her scan in Nov. ? ?Dosing: '60mg'$  q47month ? ?Dose adjustments: ?Renal: Monitor patients with severe impairment (CrCl <30 mL/minute or on dialysis) closely, as significant and prolonged hypocalcemia (incidence of 29% and potentially lasting weeks to months) and marked elevations of serum parathyroid hormone are serious risks in this population. Ensure adequate calcium and vitamin D intake/supplementation. ?CrCl ?30 mL/minute: No dosage adjustment necessary. ?CrCl <30 mL/minute: No dosage adjustment necessary; use in conjunction with guidance from patient's nephrology team. ?Hepatic: no dose adjustments (has not been studied) ? ?Drug-drug interactions: none identified  ? ?Screening: ?TB test: completed  ?Hepatitis: completed  ? ?Monitoring: ?S/sx of infection: none  ?S/sx of hypersensitivity: none  ?S/sx of hypocalcemia/hypercalcemia: none  ?Dermatitis/skin rash: none  ?Peripheral edema: none  ?HA: none  ?GI upset: none  ? ?Other side effects: none  ? ?Last bone density study: 01/2021 ? ? ?O:  ?   ? ?Lab Results  ?Component Value Date  ? WBC 6.0 06/19/2020  ? HGB 14.9 06/19/2020  ? HCT 44.7 06/19/2020  ? MCV 100.6 (H) 06/19/2020  ? PLT 249.0 06/19/2020  ? ? ?  Chemistry   ?   ?Component Value Date/Time  ? NA 140 06/19/2020 0849  ? NA 141 05/26/2019 0000  ? K 4.1 06/19/2020 0849  ? CL 104 06/19/2020 0849  ? CO2 30 06/19/2020 0849  ? BUN 14 06/19/2020 0849  ? BUN 17 05/26/2019 0000  ? CREATININE 0.71 06/19/2020 0849  ? GLU 75 05/26/2019 0000  ?    ?Component Value Date/Time  ? CALCIUM 9.3 06/19/2020 0849  ? ALKPHOS 19 (L) 06/19/2020 0849  ? AST 20 06/19/2020 0849  ? ALT 20 06/19/2020 0849  ? BILITOT 0.4 06/19/2020 0849  ?  ? ? ? ?A/P: ?1.  Medication review: Patient is taking Prolia for osteoporosis. Reviewed the medication with the patient, including the following: Prolia (denosumab) is a monoclonal antibody with affinity for nuclear factor-kappa ligand (RANKL). Prolia binds to RANKL and prevents osteoclast formation, leading to decreased bone resorption and increased bone mass in osteoporosis. Patient educated on purpose, proper use, and potential adverse effects of Prolia. The most common adverse effects are hypersensitivities, peripheral edema, dermatitis/skin rash, GI upset, HA, joint pain, and infection. There is the possibility of atypical femur fracture, serum calcium disturbances, and osteonecrosis of the jaw. Patients should monitor for and report hip, thigh, or groin pain. Additionally, patients should monitor for and report jaw pain, tooth/periodontal infection, toothache, and/or gingival ulceration/erosion. Prolia exists as a solution prefilled syringe for SQ administration. Administration: Denosumab is intended for SubQ route only and should not be administered IV, IM, or intradermally. Prior to administration, bring to room temperature in original container (allow to stand ~15 to 30 minutes); do not warm by any other method. Solution may contain trace amounts of translucent to white protein particles; do not use if cloudy, discolored (normal solution should be clear and colorless to pale yellow), or contains excessive particles or foreign matter. Avoid vigorous shaking. Administer via SubQ injection in the upper arm, upper thigh, or abdomen; should only be administered by a health care professional. No recommendations for any changes at this time.  ? ?LBenard Halsted PharmD, BCACP, CPP ?Clinical Pharmacist ?[employer] ?[phone] ? ?

## 2021-06-19 NOTE — Progress Notes (Signed)
Prolia injection administered left arm Sub Q. Patient tolerated well.  ?

## 2021-06-19 NOTE — Patient Instructions (Addendum)
Please continue the same Fosamax, Prolia, Vitamin-D, and calcium.  ?Blood tests are requested for you today.  We'll let you know about the results.   ?Please call [phone] to schedule a Bone Density (DexaScan) a the [place] office at [place].  Please do this after 01/20/21.    ?Please come back for a follow-up appointment in 1 year.   ?

## 2021-06-20 LAB — BASIC METABOLIC PANEL
BUN: 21 mg/dL (ref 6–23)
CO2: 28 mEq/L (ref 19–32)
Calcium: 9.5 mg/dL (ref 8.4–10.5)
Chloride: 103 mEq/L (ref 96–112)
Creatinine, Ser: 0.77 mg/dL (ref 0.40–1.20)
GFR: 84.1 mL/min (ref >60.00)
Glucose, Bld: 84 mg/dL (ref 70–99)
Potassium: 4.7 mEq/L (ref 3.5–5.1)
Sodium: 139 mEq/L (ref 135–145)

## 2021-06-20 LAB — TSH: TSH: 1.72 u[IU]/mL (ref 0.35–5.50)

## 2021-06-20 LAB — PTH, INTACT AND CALCIUM
Calcium: 9.4 mg/dL (ref 8.6–10.4)
PTH: 49 pg/mL (ref 16–77)

## 2021-06-20 LAB — VITAMIN D 25 HYDROXY (VIT D DEFICIENCY, FRACTURES): VITD: 29.04 ng/mL — ABNORMAL LOW (ref 30.00–100.00)

## 2021-07-02 ENCOUNTER — Other Ambulatory Visit (HOSPITAL_COMMUNITY): Payer: Self-pay

## 2021-07-02 ENCOUNTER — Ambulatory Visit (INDEPENDENT_AMBULATORY_CARE_PROVIDER_SITE_OTHER): Payer: 59 | Admitting: Internal Medicine

## 2021-07-02 ENCOUNTER — Encounter: Payer: Self-pay | Admitting: Internal Medicine

## 2021-07-02 VITALS — BP 136/78 | HR 81 | Temp 98.5°F | Resp 16 | Ht 60.0 in | Wt 135.0 lb

## 2021-07-02 DIAGNOSIS — T63483A Toxic effect of venom of other arthropod, assault, initial encounter: Secondary | ICD-10-CM | POA: Diagnosis not present

## 2021-07-02 DIAGNOSIS — F411 Generalized anxiety disorder: Secondary | ICD-10-CM | POA: Diagnosis not present

## 2021-07-02 DIAGNOSIS — E785 Hyperlipidemia, unspecified: Secondary | ICD-10-CM | POA: Diagnosis not present

## 2021-07-02 DIAGNOSIS — Z0000 Encounter for general adult medical examination without abnormal findings: Secondary | ICD-10-CM

## 2021-07-02 DIAGNOSIS — M810 Age-related osteoporosis without current pathological fracture: Secondary | ICD-10-CM | POA: Diagnosis not present

## 2021-07-02 DIAGNOSIS — K635 Polyp of colon: Secondary | ICD-10-CM

## 2021-07-02 DIAGNOSIS — T63481A Toxic effect of venom of other arthropod, accidental (unintentional), initial encounter: Secondary | ICD-10-CM | POA: Insufficient documentation

## 2021-07-02 LAB — BASIC METABOLIC PANEL
BUN: 18 mg/dL (ref 6–23)
CO2: 29 mEq/L (ref 19–32)
Calcium: 9.2 mg/dL (ref 8.4–10.5)
Chloride: 104 mEq/L (ref 96–112)
Creatinine, Ser: 1.02 mg/dL (ref 0.40–1.20)
GFR: 60 mL/min — ABNORMAL LOW (ref >60.00)
Glucose, Bld: 90 mg/dL (ref 70–99)
Potassium: 3.9 mEq/L (ref 3.5–5.1)
Sodium: 139 mEq/L (ref 135–145)

## 2021-07-02 LAB — CBC WITH DIFFERENTIAL/PLATELET
Basophils Absolute: 0.1 10*3/uL (ref 0.0–0.1)
Basophils Relative: 1 % (ref 0.0–3.0)
Eosinophils Absolute: 0.2 10*3/uL (ref 0.0–0.7)
Eosinophils Relative: 3.1 % (ref 0.0–5.0)
HCT: 42.9 % (ref 36.0–46.0)
Hemoglobin: 14.2 g/dL (ref 12.0–15.0)
Lymphocytes Relative: 28.1 % (ref 12.0–46.0)
Lymphs Abs: 2 10*3/uL (ref 0.7–4.0)
MCHC: 33.1 g/dL (ref 30.0–36.0)
MCV: 100.2 fl — ABNORMAL HIGH (ref 78.0–100.0)
Monocytes Absolute: 0.4 10*3/uL (ref 0.1–1.0)
Monocytes Relative: 5.7 % (ref 3.0–12.0)
Neutro Abs: 4.4 10*3/uL (ref 1.4–7.7)
Neutrophils Relative %: 62.1 % (ref 43.0–77.0)
Platelets: 254 10*3/uL (ref 150.0–400.0)
RBC: 4.28 Mil/uL (ref 3.87–5.11)
RDW: 12.9 % (ref 11.5–15.5)
WBC: 7.1 10*3/uL (ref 4.0–10.5)

## 2021-07-02 LAB — HEPATIC FUNCTION PANEL
ALT: 22 U/L (ref 0–35)
AST: 19 U/L (ref 0–37)
Albumin: 4 g/dL (ref 3.5–5.2)
Alkaline Phosphatase: 20 U/L — ABNORMAL LOW (ref 39–117)
Bilirubin, Direct: 0.1 mg/dL (ref 0.0–0.3)
Total Bilirubin: 0.5 mg/dL (ref 0.2–1.2)
Total Protein: 6.5 g/dL (ref 6.0–8.3)

## 2021-07-02 MED ORDER — METHYLPREDNISOLONE 4 MG PO TBPK
ORAL_TABLET | ORAL | 0 refills | Status: AC
  Filled 2021-07-02: qty 21, 6d supply, fill #0

## 2021-07-02 MED ORDER — TRAZODONE HCL 50 MG PO TABS
50.0000 mg | ORAL_TABLET | Freq: Every day | ORAL | 1 refills | Status: DC
  Filled 2021-07-02: qty 90, 90d supply, fill #0
  Filled 2021-09-23: qty 90, 90d supply, fill #1

## 2021-07-02 MED ORDER — HYDROXYZINE PAMOATE 25 MG PO CAPS
25.0000 mg | ORAL_CAPSULE | Freq: Three times a day (TID) | ORAL | 0 refills | Status: DC | PRN
  Filled 2021-07-02: qty 30, 10d supply, fill #0

## 2021-07-02 NOTE — Patient Instructions (Signed)

## 2021-07-02 NOTE — Progress Notes (Signed)
? ?Subjective:  ?Patient ID: Angela Lam, female    DOB: 1960-11-12  Age: 61 y.o. MRN: [id number] ? ?CC: Annual Exam and Rash ? ? ?HPI ?Angela Lam presents for a CPX  and f/up - ? ?She complains of a 2-week history of redness, swelling, and itching on her right proximal forearm.  She thinks she has a bug bite.  She has treated this with calamine lotion.  She denies facial swelling, shortness of breath, or wheezing. ? ? ? ?She complains of an insect bite on her right forearm that has been itching and swelling for 2 weeks.  She has treated it with calamine lotion.  She has had no trouble breathing and denies wheezing or facial swelling. ? ?Outpatient Medications Prior to Visit  ?Medication Sig Dispense Refill  ? aspirin EC 81 MG tablet Take 81 mg by mouth every other day.    ? Calcium Carbonate-Vitamin D 600-400 MG-UNIT tablet     ? citalopram (CELEXA) 20 MG tablet Take 1 tablet (20 mg total) by mouth in the morning. 90 tablet 1  ? denosumab (PROLIA) 60 MG/ML SOSY injection INJECT '60MG'$  INTO THE SKIN EVERY 6 MONTHS. 1 mL 1  ? simvastatin (ZOCOR) 40 MG tablet Take 1 tablet (40 mg total) by mouth at bedtime. 90 tablet 1  ? alendronate (FOSAMAX) 70 MG tablet Take 1 tablet (70 mg total) by mouth once a week before first meal or beverage of the day. 8 tablet 3  ? ?No facility-administered medications prior to visit.  ? ? ?ROS ?Review of Systems  ?Constitutional:  Negative for diaphoresis, fatigue and unexpected weight change.  ?HENT: Negative.    ?Eyes: Negative.   ?Respiratory:  Negative for cough, shortness of breath and wheezing.   ?Cardiovascular:  Negative for chest pain, palpitations and leg swelling.  ?Gastrointestinal:  Negative for abdominal pain, diarrhea, nausea and vomiting.  ?Endocrine: Negative.   ?Genitourinary: Negative.  Negative for difficulty urinating.  ?Musculoskeletal: Negative.  Negative for myalgias.  ?Skin:  Positive for color change and rash. Negative for pallor and wound.  ?Neurological:  Negative.  Negative for dizziness, weakness and headaches.  ?Hematological:  Negative for adenopathy. Does not bruise/bleed easily.  ?Psychiatric/Behavioral:  Positive for sleep disturbance. Negative for confusion, decreased concentration, dysphoric mood and suicidal ideas. The patient is nervous/anxious.   ? ?Objective:  ?BP 136/78 (BP Location: Right Arm, Patient Position: Sitting, Cuff Size: Large)   Pulse 81   Temp 98.5 ?F (36.9 ?C) (Oral)   Resp 16   Ht 5' (1.524 m)   Wt 135 lb (61.2 kg)   SpO2 96%   BMI 26.37 kg/m?  ? ?BP Readings from Last 3 Encounters:  ?07/02/21 136/78  ?06/19/21 122/78  ?01/30/21 132/80  ? ? ?Wt Readings from Last 3 Encounters:  ?07/02/21 135 lb (61.2 kg)  ?06/19/21 138 lb (62.6 kg)  ?01/30/21 130 lb (59 kg)  ? ? ?Physical Exam ?Vitals reviewed.  ?HENT:  ?   Nose: Nose normal.  ?   Mouth/Throat:  ?   Mouth: Mucous membranes are moist.  ?Eyes:  ?   General: No scleral icterus. ?   Conjunctiva/sclera: Conjunctivae normal.  ?Cardiovascular:  ?   Rate and Rhythm: Normal rate and regular rhythm.  ?   Heart sounds: No murmur heard. ?Pulmonary:  ?   Effort: Pulmonary effort is normal.  ?   Breath sounds: No stridor. No wheezing, rhonchi or rales.  ?Abdominal:  ?   Palpations: There is no mass.  ?  Tenderness: There is no abdominal tenderness. There is no guarding.  ?   Hernia: No hernia is present.  ?Musculoskeletal:  ?   Cervical back: Neck supple.  ?Lymphadenopathy:  ?   Cervical: No cervical adenopathy.  ?Skin: ?   General: Skin is warm and dry.  ?   Findings: Erythema and rash present. No lesion.  ?Neurological:  ?   General: No focal deficit present.  ?Psychiatric:     ?   Mood and Affect: Mood normal.     ?   Behavior: Behavior normal.  ? ? ?Lab Results  ?Component Value Date  ? WBC 7.1 07/02/2021  ? HGB 14.2 07/02/2021  ? HCT 42.9 07/02/2021  ? PLT 254.0 07/02/2021  ? GLUCOSE 90 07/02/2021  ? CHOL 191 06/19/2020  ? TRIG 61.0 06/19/2020  ? HDL 81.60 06/19/2020  ? Nuevo 97  06/19/2020  ? ALT 22 07/02/2021  ? AST 19 07/02/2021  ? NA 139 07/02/2021  ? K 3.9 07/02/2021  ? CL 104 07/02/2021  ? CREATININE 1.02 07/02/2021  ? BUN 18 07/02/2021  ? CO2 29 07/02/2021  ? TSH 1.72 06/19/2021  ? ? ?No results found. ? ?Assessment & Plan:  ? ?Angela Lam was seen today for annual exam and rash. ? ?Diagnoses and all orders for this visit: ? ?Routine general medical examination at a health care facility- Exam completed, labs reviewed, vaccines are up-to-date, cancer screenings addressed, patient education was given. ? ?GAD (generalized anxiety disorder)-- I recommended that she add trazodone to Celexa. ?-     traZODone (DESYREL) 50 MG tablet; Take 1 tablet (50 mg total) by mouth at bedtime. ?-     CBC with Differential/Platelet; Future ?-     CBC with Differential/Platelet ? ?Osteoporosis without current pathological fracture, unspecified osteoporosis type- I have asked her to stop taking Fosamax and to stay on Prolia. ?-     Basic metabolic panel; Future ?-     CBC with Differential/Platelet; Future ?-     CBC with Differential/Platelet ?-     Basic metabolic panel ? ?Hyperlipidemia with target LDL less than 130- LDL goal achieved. Doing well on the statin  ?-     CBC with Differential/Platelet; Future ?-     Hepatic function panel; Future ?-     Hepatic function panel ?-     CBC with Differential/Platelet ? ?Allergic reaction to insect sting, assault, initial encounter ?-     methylPREDNISolone (MEDROL DOSEPAK) 4 MG TBPK tablet; TAKE AS DIRECTED ?-     hydrOXYzine (VISTARIL) 25 MG capsule; Take 1 capsule (25 mg total) by mouth every 8 (eight) hours as needed. ? ? ?I have discontinued Angela Lam's alendronate. I am also having her start on traZODone, methylPREDNISolone, and hydrOXYzine. Additionally, I am having her maintain her Calcium Carbonate-Vitamin D, aspirin EC, Prolia, citalopram, and simvastatin. ? ?Meds ordered this encounter  ?Medications  ? traZODone (DESYREL) 50 MG tablet  ?  Sig: Take 1  tablet (50 mg total) by mouth at bedtime.  ?  Dispense:  90 tablet  ?  Refill:  1  ? methylPREDNISolone (MEDROL DOSEPAK) 4 MG TBPK tablet  ?  Sig: TAKE AS DIRECTED  ?  Dispense:  21 tablet  ?  Refill:  0  ? hydrOXYzine (VISTARIL) 25 MG capsule  ?  Sig: Take 1 capsule (25 mg total) by mouth every 8 (eight) hours as needed.  ?  Dispense:  30 capsule  ?  Refill:  0  ? ? ? ?  Follow-up: Return in about 6 months (around 01/02/2022). ? ?Angela Calico, MD ?

## 2021-07-03 ENCOUNTER — Encounter: Payer: Self-pay | Admitting: Endocrinology

## 2021-07-21 ENCOUNTER — Emergency Department
Admission: EM | Admit: 2021-07-21 | Discharge: 2021-07-21 | Disposition: A | Discharge status: Home / Self Care | Payer: 59 | Source: Home / Self Care

## 2021-07-21 ENCOUNTER — Encounter: Payer: Self-pay | Admitting: Emergency Medicine

## 2021-07-21 ENCOUNTER — Emergency Department (INDEPENDENT_AMBULATORY_CARE_PROVIDER_SITE_OTHER): Payer: 59

## 2021-07-21 ENCOUNTER — Other Ambulatory Visit: Payer: Self-pay

## 2021-07-21 DIAGNOSIS — S99922A Unspecified injury of left foot, initial encounter: Secondary | ICD-10-CM

## 2021-07-21 DIAGNOSIS — M79672 Pain in left foot: Secondary | ICD-10-CM | POA: Diagnosis not present

## 2021-07-21 DIAGNOSIS — M79675 Pain in left toe(s): Secondary | ICD-10-CM | POA: Diagnosis not present

## 2021-07-21 NOTE — ED Triage Notes (Signed)
Bruising & pain to left small toe after an aluminum ladder fell on the left foot at 5 pm yesterday  Aleve last night  Here w/ husband

## 2021-07-21 NOTE — ED Provider Notes (Signed)
[name]    CSN: [id number] Arrival date & time: 07/21/21  0998      History   Chief Complaint Chief Complaint  Patient presents with   Toe Injury    Left small toe    HPI Angela Lam is a 61 y.o. female.   HPI 61 year old female presents with left foot fifth toe pain since yesterday at 5 PM.  Patient reports aluminum ladder fell on left foot, reports previous fracture of left fifth toe.  PMH significant for HLD and GAD.  Patient is accompanied by her husband this morning  Past Medical History:  Diagnosis Date   Blood in stool    Chicken pox    Depression    Hyperlipidemia     Patient Active Problem List   Diagnosis Date Noted   GAD (generalized anxiety disorder) 07/02/2021   Allergic reaction to insect sting 07/02/2021   Cervical cancer screening 01/02/2021   Routine general medical examination at a health care facility 06/19/2020   Polyp of colon 06/19/2020   Visit for screening mammogram 06/19/2020   Hyperlipidemia with target LDL less than 130 03/21/2020   Osteoporosis 02/09/2020    Past Surgical History:  Procedure Laterality Date   BREAST BIOPSY Right 2013   benign    OB History   No obstetric history on file.      Home Medications    Prior to Admission medications   Medication Sig Start Date End Date Taking? Authorizing Provider  cholecalciferol (VITAMIN D3) 25 MCG (1000 UNIT) tablet Take 2,000 Units by mouth daily.   Yes [provider]  aspirin EC 81 MG tablet Take 81 mg by mouth every other day.    [provider]  Calcium Carbonate-Vitamin D 600-400 MG-UNIT tablet     [provider]  citalopram (CELEXA) 20 MG tablet Take 1 tablet (20 mg total) by mouth in the morning. 03/05/21   Janith Lima, MD  denosumab (PROLIA) 60 MG/ML SOSY injection INJECT '60MG'$  INTO THE SKIN EVERY 6 MONTHS. 12/03/20   Tresa Garter, MD  hydrOXYzine (VISTARIL) 25 MG capsule Take 1 capsule (25 mg total) by mouth every 8  (eight) hours as needed. 07/02/21   Janith Lima, MD  simvastatin (ZOCOR) 40 MG tablet Take 1 tablet (40 mg total) by mouth at bedtime. 04/24/21   Janith Lima, MD  traZODone (DESYREL) 50 MG tablet Take 1 tablet (50 mg total) by mouth at bedtime. 07/02/21   Janith Lima, MD    Family History Family History  Problem Relation Age of Onset   Asthma Mother    Cancer Mother    COPD Mother    Diabetes Mother    Early death Mother    Heart disease Mother    Hyperlipidemia Mother    Kidney disease Mother    65 / [place] Mother    Stroke Mother    Heart attack Mother    Cancer Father    Heart disease Father    Arthritis Sister    Asthma Sister    COPD Sister    Depression Sister    Diabetes Sister    Heart disease Sister    Hyperlipidemia Sister    Hypertension Sister    Heart attack Sister    Osteoporosis Maternal Grandmother    Breast cancer Neg Hx     Social History Social History   Tobacco Use   Smoking status: Never    Passive exposure: Never   Smokeless  tobacco: Never  Vaping Use   Vaping Use: Never used  Substance Use Topics   Alcohol use: Not Currently    Comment: social drinker   Drug use: Never     Allergies   Patient has no known allergies.   Review of Systems Review of Systems  Musculoskeletal:        Left fifth toe pain since 5 PM yesterday  All other systems reviewed and are negative.   Physical Exam Triage Vital Signs ED Triage Vitals  Enc Vitals Group     BP 07/21/21 0950 120/79     Pulse Rate 07/21/21 0950 87     Resp 07/21/21 0950 15     Temp 07/21/21 0950 99 F (37.2 C)     Temp Source 07/21/21 0950 Oral     SpO2 07/21/21 0950 98 %     Weight 07/21/21 0956 135 lb (61.2 kg)     Height 07/21/21 0956 5' (1.524 m)     Head Circumference --      Peak Flow --      Pain Score 07/21/21 0955 6     Pain Loc --      Pain Edu? --      Excl. in [place]? --    No data found.  Updated Vital Signs BP 120/79 (BP Location: Left  Arm)   Pulse 87   Temp 99 F (37.2 C) (Oral)   Resp 15   Ht 5' (1.524 m)   Wt 135 lb (61.2 kg)   SpO2 98%   BMI 26.37 kg/m       Physical Exam Vitals and nursing note reviewed.  Constitutional:      Appearance: Normal appearance. She is normal weight.  HENT:     Head: Normocephalic and atraumatic.     Mouth/Throat:     Mouth: Mucous membranes are moist.     Pharynx: Oropharynx is clear.  Eyes:     Extraocular Movements: Extraocular movements intact.     Conjunctiva/sclera: Conjunctivae normal.     Pupils: Pupils are equal, round, and reactive to light.  Cardiovascular:     Rate and Rhythm: Normal rate and regular rhythm.     Pulses: Normal pulses.     Heart sounds: Normal heart sounds.  Pulmonary:     Effort: Pulmonary effort is normal.     Breath sounds: Normal breath sounds. No wheezing, rhonchi or rales.  Musculoskeletal:     Cervical back: Normal range of motion and neck supple.     Comments: Left foot/fifth toe (dorsum): TTP, with mild soft tissue swelling and significant ecchymosis noted, LROM  Skin:    General: Skin is warm and dry.  Neurological:     General: No focal deficit present.     Mental Status: She is alert and oriented to person, place, and time. Mental status is at baseline.     UC Treatments / Results  Labs (all labs ordered are listed, but only abnormal results are displayed) Labs Reviewed - No data to display  EKG   Radiology DG Foot Complete Left  Result Date: 07/21/2021 CLINICAL DATA:  Acute LEFT foot pain following injury yesterday. Initial encounter. EXAM: LEFT FOOT - COMPLETE 3 VIEW COMPARISON:  None Available. FINDINGS: There is no evidence of fracture or dislocation. There is no evidence of arthropathy or other focal bone abnormality. Soft tissues are unremarkable. IMPRESSION: Negative. Electronically Signed   By: Margarette Canada M.D.   On: 07/21/2021 10:14  Procedures Procedures (including critical care time)  Medications Ordered  in UC Medications - No data to display  Initial Impression / Assessment and Plan / UC Course  I have reviewed the triage vital signs and the nursing notes.  Pertinent labs & imaging results that were available during my care of the patient were reviewed by me and considered in my medical decision making (see chart for details).     MDM: 1. Toe pain, left: Left foot x-ray revealed above. Advised/informed patient of left foot x-ray with hard copy provided.  Advised patient to RICE affected area of left foot for 25 minutes 3 times daily for the next 3 days.  Advised may take OTC Ibuprofen 400 mg daily, as needed for left fifth toe pain.  Advised patient if symptoms worsen and/or unresolved please follow-up with PCP or [facility] orthopedic provider above for further evaluation.  Work note provided per patient request. Patient discharged home, hemodynamically stable. Final Clinical Impressions(s) / UC Diagnoses   Final diagnoses:  Toe pain, left     Discharge Instructions      Advised/informed patient of left foot x-ray with hard copy provided.  Advised patient to RICE affected area of left foot for 25 minutes 3 times daily for the next 3 days.  Advised may take OTC Ibuprofen 400 mg daily, as needed for left fifth toe pain.  Advised patient if symptoms worsen and/or unresolved please follow-up with PCP or [facility] orthopedic provider above for further evaluation.     ED Prescriptions   None    PDMP not reviewed this encounter.   Eliezer Lofts, [place] 07/21/21 1105

## 2021-07-21 NOTE — Discharge Instructions (Addendum)
Advised/informed patient of left foot x-ray with hard copy provided.  Advised patient to RICE affected area of left foot for 25 minutes 3 times daily for the next 3 days.  Advised may take OTC Ibuprofen 400 mg daily, as needed for left fifth toe pain.  Advised patient if symptoms worsen and/or unresolved please follow-up with PCP or [facility] orthopedic provider above for further evaluation.

## 2021-07-23 ENCOUNTER — Other Ambulatory Visit (HOSPITAL_COMMUNITY): Payer: Self-pay

## 2021-08-02 ENCOUNTER — Ambulatory Visit: Payer: 59 | Admitting: Endocrinology

## 2021-08-26 ENCOUNTER — Other Ambulatory Visit: Payer: Self-pay | Admitting: Internal Medicine

## 2021-08-26 ENCOUNTER — Other Ambulatory Visit (HOSPITAL_COMMUNITY): Payer: Self-pay

## 2021-08-26 MED ORDER — CITALOPRAM HYDROBROMIDE 20 MG PO TABS
20.0000 mg | ORAL_TABLET | Freq: Every morning | ORAL | 1 refills | Status: DC
  Filled 2021-08-26: qty 90, 90d supply, fill #0
  Filled 2021-11-29: qty 90, 90d supply, fill #1

## 2021-09-18 ENCOUNTER — Encounter: Payer: Self-pay | Admitting: Gastroenterology

## 2021-09-23 ENCOUNTER — Other Ambulatory Visit (HOSPITAL_COMMUNITY): Payer: Self-pay

## 2021-10-11 ENCOUNTER — Ambulatory Visit (AMBULATORY_SURGERY_CENTER): Payer: Self-pay

## 2021-10-11 ENCOUNTER — Other Ambulatory Visit (HOSPITAL_COMMUNITY): Payer: Self-pay

## 2021-10-11 VITALS — Ht 60.0 in | Wt 135.0 lb

## 2021-10-11 DIAGNOSIS — Z1211 Encounter for screening for malignant neoplasm of colon: Secondary | ICD-10-CM

## 2021-10-11 MED ORDER — NA SULFATE-K SULFATE-MG SULF 17.5-3.13-1.6 GM/177ML PO SOLN
1.0000 | ORAL | 0 refills | Status: DC
  Filled 2021-10-11: qty 354, 1d supply, fill #0

## 2021-10-11 NOTE — Progress Notes (Signed)
No egg or soy allergy known to patient  No issues known to pt with past sedation with any surgeries or procedures Patient denies ever being told they had issues or difficulty with intubation  No FH of Malignant Hyperthermia Pt is not on diet pills Pt is not on  home 02  Pt is not on blood thinners  Pt denies issues with constipation  No A fib or A flutter Have any cardiac testing pending--denied Pt instructed to use Single[employer].com or GoodRx for a price reduction on prep   

## 2021-10-21 ENCOUNTER — Other Ambulatory Visit (HOSPITAL_COMMUNITY): Payer: Self-pay

## 2021-10-21 ENCOUNTER — Other Ambulatory Visit: Payer: Self-pay | Admitting: Internal Medicine

## 2021-10-21 MED ORDER — SIMVASTATIN 40 MG PO TABS
40.0000 mg | ORAL_TABLET | Freq: Every day | ORAL | 1 refills | Status: DC
  Filled 2021-10-21: qty 90, 90d supply, fill #0
  Filled 2022-01-15: qty 90, 90d supply, fill #1

## 2021-10-25 ENCOUNTER — Encounter: Payer: Self-pay | Admitting: Gastroenterology

## 2021-11-08 ENCOUNTER — Ambulatory Visit (AMBULATORY_SURGERY_CENTER): Payer: 59 | Admitting: Gastroenterology

## 2021-11-08 ENCOUNTER — Encounter: Payer: Self-pay | Admitting: Gastroenterology

## 2021-11-08 VITALS — BP 108/62 | HR 64 | Temp 97.1°F | Resp 13 | Ht 60.0 in | Wt 135.0 lb

## 2021-11-08 DIAGNOSIS — D122 Benign neoplasm of ascending colon: Secondary | ICD-10-CM

## 2021-11-08 DIAGNOSIS — Z1211 Encounter for screening for malignant neoplasm of colon: Secondary | ICD-10-CM

## 2021-11-08 MED ORDER — SODIUM CHLORIDE 0.9 % IV SOLN: 500.0000 mL | Freq: Once | INTRAVENOUS | Status: DC

## 2021-11-08 NOTE — Progress Notes (Signed)
Pt's states no medical or surgical changes since previsit or office visit. 

## 2021-11-08 NOTE — Progress Notes (Signed)
Called to room to assist during endoscopic procedure.  Patient ID and intended procedure confirmed with present staff. Received instructions for my participation in the procedure from the performing physician.  

## 2021-11-08 NOTE — Patient Instructions (Signed)
HANDOUTS PROVIDED ON: HIGH FIBER DIET & DIVERTICULOSIS  The polyp removed today have been sent for pathology.  The results can take 1-3 weeks to receive.  When your next colonoscopy should occur will be based on the pathology results.    You may resume your previous diet and medication schedule.  Thank you for allowing [place] to care for you today!!!   YOU HAD AN ENDOSCOPIC PROCEDURE TODAY AT [place]:   Refer to the procedure report that was given to you for any specific questions about what was found during the examination.  If the procedure report does not answer your questions, please call your gastroenterologist to clarify.  If you requested that your care partner not be given the details of your procedure findings, then the procedure report has been included in a sealed envelope for you to review at your convenience later.  YOU SHOULD EXPECT: Some feelings of bloating in the abdomen. Passage of more gas than usual.  Walking can help get rid of the air that was put into your GI tract during the procedure and reduce the bloating. If you had a lower endoscopy (such as a colonoscopy or flexible sigmoidoscopy) you may notice spotting of blood in your stool or on the toilet paper. If you underwent a bowel prep for your procedure, you may not have a normal bowel movement for a few days.  Please Note:  You might notice some irritation and congestion in your nose or some drainage.  This is from the oxygen used during your procedure.  There is no need for concern and it should clear up in a day or so.  SYMPTOMS TO REPORT IMMEDIATELY:  Following lower endoscopy (colonoscopy or flexible sigmoidoscopy):  Excessive amounts of blood in the stool  Significant tenderness or worsening of abdominal pains  Swelling of the abdomen that is new, acute  Fever of 100F or higher  For urgent or emergent issues, a gastroenterologist can be reached at any hour by calling [phone]. Do not use  MyChart messaging for urgent concerns.    DIET:  We do recommend a small meal at first, but then you may proceed to your regular diet.  Drink plenty of fluids but you should avoid alcoholic beverages for 24 hours.  ACTIVITY:  You should plan to take it easy for the rest of today and you should NOT DRIVE or use heavy machinery until tomorrow (because of the sedation medicines used during the test).    FOLLOW UP: Our staff will call the number listed on your records the next business day following your procedure.  We will call around 7:15- 8:00 am to check on you and address any questions or concerns that you may have regarding the information given to you following your procedure. If we do not reach you, we will leave a message.  If you develop any symptoms (ie: fever, flu-like symptoms, shortness of breath, cough etc.) before then, please call [phone].  If you test positive for Covid 19 in the 2 weeks post procedure, please call and report this information to [place].    If any biopsies were taken you will be contacted by phone or by letter within the next 1-3 weeks.  Please call us at [phone] if you have not heard about the biopsies in 3 weeks.    SIGNATURES/CONFIDENTIALITY: You and/or your care partner have signed paperwork which will be entered into your electronic medical record.  These signatures attest to the fact  that that the information above on your After Visit Summary has been reviewed and is understood.  Full responsibility of the confidentiality of this discharge information lies with you and/or your care-partner.

## 2021-11-08 NOTE — Progress Notes (Signed)
Referring Provider: Janith Lima, MD Primary Care Physician:  Janith Lima, MD  Indication for Procedure:  Colon cancer Surveillance   IMPRESSION:  Need for colon cancer surveillance Appropriate candidate for monitored anesthesia care  PLAN: Colonoscopy in [place] today   HPI: Angela Lam is a 61 y.o. female presents for surveillance colonoscopy.  Prior endoscopic history: Colonoscopy 2013 in [place] per patient report  No known family history of colon cancer or polyps. No family history of uterine/endometrial cancer, pancreatic cancer or gastric/stomach cancer.   Past Medical History:  Diagnosis Date   Anxiety    Blood in stool    Chicken pox    Depression    Hyperlipidemia    Osteoporosis     Past Surgical History:  Procedure Laterality Date   BREAST BIOPSY Right 2013   benign   COLOSTOMY     >34yr ago    Current Outpatient Medications  Medication Sig Dispense Refill   aspirin EC 81 MG tablet Take 81 mg by mouth every other day.     Calcium Carbonate-Vitamin D 600-400 MG-UNIT tablet      cholecalciferol (VITAMIN D3) 25 MCG (1000 UNIT) tablet Take 2,000 Units by mouth daily.     citalopram (CELEXA) 20 MG tablet Take 1 tablet (20 mg total) by mouth in the morning. 90 tablet 1   simvastatin (ZOCOR) 40 MG tablet Take 1 tablet (40 mg total) by mouth at bedtime. 90 tablet 1   traZODone (DESYREL) 50 MG tablet Take 1 tablet (50 mg total) by mouth at bedtime. 90 tablet 1   denosumab (PROLIA) 60 MG/ML SOSY injection INJECT '60MG'$  INTO THE SKIN EVERY 6 MONTHS. 1 mL 1   Current Facility-Administered Medications  Medication Dose Route Frequency Provider Last Rate Last Admin   0.9 %  sodium chloride infusion  500 mL Intravenous Once BThornton Park MD        Allergies as of 11/08/2021   (No Known Allergies)    Family History  Problem Relation Age of Onset   Asthma Mother    Cancer Mother    COPD Mother    Diabetes Mother    Early death Mother    Heart  disease Mother    Hyperlipidemia Mother    Kidney disease Mother    M23/ [place]    Stroke Mother    Heart attack Mother    Cancer Father    Heart disease Father    Arthritis Sister    Asthma Sister    COPD Sister    Depression Sister    Diabetes Sister    Heart disease Sister    Hyperlipidemia Sister    Hypertension Sister    Heart attack Sister    Osteoporosis Maternal Grandmother    Breast cancer Neg Hx    Colon cancer Neg Hx    Colon polyps Neg Hx    Esophageal cancer Neg Hx    Rectal cancer Neg Hx    Stomach cancer Neg Hx      Physical Exam: General:   Alert,  well-nourished, pleasant and cooperative in NAD Head:  Normocephalic and atraumatic. Eyes:  Sclera clear, no icterus.   Conjunctiva pink. Mouth:  No deformity or lesions.   Neck:  Supple; no masses or thyromegaly. Lungs:  Clear throughout to auscultation.   No wheezes. Heart:  Regular rate and rhythm; no murmurs. Abdomen:  Soft, non-tender, nondistended, normal bowel sounds, no rebound or guarding.  Msk:  Symmetrical. No boney  deformities LAD: No inguinal or umbilical LAD Extremities:  No clubbing or edema. Neurologic:  Alert and  oriented x4;  grossly nonfocal Skin:  No obvious rash or bruise. Psych:  Alert and cooperative. Normal mood and affect.     Studies/Results: No results found.    Elnore Cosens L. Tarri Glenn, MD, MPH 11/08/2021, 7:45 AM

## 2021-11-08 NOTE — Op Note (Signed)
[facility] Patient Name: Angela Lam Procedure Date: 11/08/2021 8:33 AM MRN: [id number] Endoscopist: Thornton Park MD, MD Age: 61 Referring MD:  Date of Birth: [birth year] Gender: Female Account #: [id number] Procedure:                Colonoscopy Indications:              Screening for colorectal malignant neoplasm                           Colonoscopy in [place] in 2013                           No known family history of colon cancer or polyps Medicines:                Monitored Anesthesia Care Procedure:                Pre-Anesthesia Assessment:                           - Prior to the procedure, a History and Physical                            was performed, and patient medications and                            allergies were reviewed. The patient's tolerance of                            previous anesthesia was also reviewed. The risks                            and benefits of the procedure and the sedation                            options and risks were discussed with the patient.                            All questions were answered, and informed consent                            was obtained. Prior Anticoagulants: The patient has                            taken no previous anticoagulant or antiplatelet                            agents. ASA Grade Assessment: II - A patient with                            mild systemic disease. After reviewing the risks                            and benefits, the patient was deemed in  satisfactory condition to undergo the procedure.                           After obtaining informed consent, the colonoscope                            was passed under direct vision. Throughout the                            procedure, the patient's blood pressure, pulse, and                            oxygen saturations were monitored continuously. The                            CF [id number] #[id number] was introduced  through the anus                            and advanced to the the cecum, identified by                            appendiceal orifice and ileocecal valve. A second                            forward view of the right colon was performed. The                            colonoscopy was performed without difficulty. The                            patient tolerated the procedure well. The quality                            of the bowel preparation was good. The terminal                            ileum, ileocecal valve, appendiceal orifice, and                            rectum were photographed. Scope In: 8:37:28 AM Scope Out: 8:52:09 AM Scope Withdrawal Time: 0 hours 10 minutes 9 seconds  Total Procedure Duration: 0 hours 14 minutes 41 seconds  Findings:                 The perianal and digital rectal examinations were                            normal.                           A few small-mouthed diverticula were found in the                            sigmoid colon.  A 4 mm polyp was found in the ascending colon. The                            polyp was sessile. The polyp was removed with a                            cold snare. Resection and retrieval were complete.                            Estimated blood loss was minimal.                           The exam was otherwise without abnormality on                            direct and retroflexion views. Complications:            No immediate complications. Estimated Blood Loss:     Estimated blood loss was minimal. Impression:               - Diverticulosis in the sigmoid colon.                           - One 4 mm polyp in the ascending colon, removed                            with a cold snare. Resected and retrieved.                           - The examination was otherwise normal on direct                            and retroflexion views. Recommendation:           - Patient has a contact number  available for                            emergencies. The signs and symptoms of potential                            delayed complications were discussed with the                            patient. Return to normal activities tomorrow.                            Written discharge instructions were provided to the                            patient.                           - High fiber diet.                           - Continue present  medications.                           - Await pathology results.                           - Repeat colonoscopy date to be determined after                            pending pathology results are reviewed for                            surveillance.                           - Emerging evidence supports eating a diet of                            fruits, vegetables, grains, calcium, and yogurt                            while reducing red meat and alcohol may reduce the                            risk of colon cancer.                           - Thank you for allowing me to be involved in your                            colon cancer prevention. Thornton Park MD, MD 11/08/2021 8:56:12 AM This report has been signed electronically.

## 2021-11-08 NOTE — Progress Notes (Signed)
PT taken to PACU. Monitors in place. VSS. Report given to RN. 

## 2021-11-09 NOTE — Telephone Encounter (Signed)
Prolia VOB initiated via [email or web address] 

## 2021-11-11 ENCOUNTER — Telehealth: Payer: Self-pay

## 2021-11-11 NOTE — Telephone Encounter (Signed)
  Follow up Call-     11/08/2021    7:40 AM  Call back number  Post procedure Call Back phone  # [phone]  Permission to leave phone message Yes     Patient questions:  Do you have a fever, pain , or abdominal swelling? No. Pain Score  0 *  Have you tolerated food without any problems? Yes.    Have you been able to return to your normal activities? Yes.    Do you have any questions about your discharge instructions: Diet   No. Medications  No. Follow up visit  No.  Do you have questions or concerns about your Care? No.  Actions: * If pain score is 4 or above: No action needed, pain <4.

## 2021-11-15 ENCOUNTER — Encounter: Payer: Self-pay | Admitting: Gastroenterology

## 2021-11-18 NOTE — Telephone Encounter (Signed)
Prior auth required for PROLIA  PA PROCESS DETAILS: Prior Authorization is Required. We are unable to confirm if a PA is on file. Please check your records or contact the payer  

## 2021-11-22 ENCOUNTER — Other Ambulatory Visit: Payer: Self-pay | Admitting: Pharmacist

## 2021-11-22 ENCOUNTER — Other Ambulatory Visit (HOSPITAL_COMMUNITY): Payer: Self-pay

## 2021-11-22 ENCOUNTER — Other Ambulatory Visit: Payer: Self-pay | Admitting: Endocrinology

## 2021-11-22 MED ORDER — PROLIA 60 MG/ML ~~LOC~~ SOSY
PREFILLED_SYRINGE | SUBCUTANEOUS | 1 refills | Status: DC
  Filled 2021-11-22: qty 1, 180d supply, fill #0

## 2021-11-22 MED ORDER — PROLIA 60 MG/ML ~~LOC~~ SOSY
PREFILLED_SYRINGE | SUBCUTANEOUS | 1 refills | Status: DC
  Filled 2021-11-22: qty 1, fill #0

## 2021-11-29 ENCOUNTER — Other Ambulatory Visit (HOSPITAL_COMMUNITY): Payer: Self-pay

## 2021-12-16 ENCOUNTER — Other Ambulatory Visit (HOSPITAL_COMMUNITY): Payer: Self-pay

## 2021-12-16 MED ORDER — PROLIA 60 MG/ML ~~LOC~~ SOSY
PREFILLED_SYRINGE | SUBCUTANEOUS | 1 refills | Status: DC
  Filled 2021-12-16: qty 1, fill #0

## 2021-12-16 NOTE — Telephone Encounter (Signed)
Pt ready for scheduling on or after 12/20/21 Pt obtains from [place] cost due at time of visit: pays pharmacy at time of pick up ([place] sent)   Primary: [employer] Prolia co-insurance: 40% Admin fee co-insurance: 40%   Secondary: n/a Prolia co-insurance:  Admin fee co-insurance:    Deductible: $350 of $350 met   Prior Auth: APPROVED  PA# [id number] Valid: 06/10/21-06/10/22     ** This summary of benefits is an estimation of the patient's out-of-pocket cost. Exact cost may very based on individual plan coverage.

## 2021-12-16 NOTE — Addendum Note (Signed)
Addended by: Jasper Loser on: 12/16/2021 09:22 PM   Modules accepted: Orders

## 2021-12-17 ENCOUNTER — Other Ambulatory Visit (HOSPITAL_COMMUNITY): Payer: Self-pay

## 2021-12-17 ENCOUNTER — Other Ambulatory Visit: Payer: Self-pay | Admitting: Pharmacist

## 2021-12-17 MED ORDER — PROLIA 60 MG/ML ~~LOC~~ SOSY
PREFILLED_SYRINGE | SUBCUTANEOUS | 1 refills | Status: DC
  Filled 2021-12-17: qty 1, fill #0
  Filled 2022-05-27: qty 1, 180d supply, fill #0
  Filled 2022-12-03: qty 1, 180d supply, fill #1

## 2021-12-20 ENCOUNTER — Ambulatory Visit (INDEPENDENT_AMBULATORY_CARE_PROVIDER_SITE_OTHER): Payer: 59

## 2021-12-20 DIAGNOSIS — M810 Age-related osteoporosis without current pathological fracture: Secondary | ICD-10-CM

## 2021-12-20 MED ORDER — DENOSUMAB 60 MG/ML ~~LOC~~ SOSY
60.0000 mg | PREFILLED_SYRINGE | Freq: Once | SUBCUTANEOUS | Status: AC
  Administered 2021-12-20: 60 mg via SUBCUTANEOUS

## 2021-12-20 NOTE — Progress Notes (Signed)
Patient seen today for Prolia injection.  '60mg'$ /ml given in right arm subQ.  Patient tolerated well.  Follow up 6 months or as advised by provider.   Patient verified name, DOB and provided verbal consent prior to administration.

## 2021-12-23 ENCOUNTER — Other Ambulatory Visit: Payer: Self-pay | Admitting: Internal Medicine

## 2021-12-23 ENCOUNTER — Other Ambulatory Visit (HOSPITAL_COMMUNITY): Payer: Self-pay

## 2021-12-23 DIAGNOSIS — F411 Generalized anxiety disorder: Secondary | ICD-10-CM

## 2021-12-23 MED ORDER — TRAZODONE HCL 50 MG PO TABS
50.0000 mg | ORAL_TABLET | Freq: Every day | ORAL | 1 refills | Status: DC
  Filled 2021-12-23: qty 90, 90d supply, fill #0
  Filled 2022-03-25: qty 90, 90d supply, fill #1

## 2021-12-26 NOTE — Telephone Encounter (Signed)
Patient has had injection and is scheduled for next injection as well.

## 2022-01-01 ENCOUNTER — Ambulatory Visit: Payer: 59 | Admitting: Internal Medicine

## 2022-01-01 ENCOUNTER — Encounter: Payer: Self-pay | Admitting: Internal Medicine

## 2022-01-01 VITALS — BP 124/76 | HR 83 | Temp 98.5°F | Ht 60.0 in | Wt 135.0 lb

## 2022-01-01 DIAGNOSIS — H547 Unspecified visual loss: Secondary | ICD-10-CM | POA: Insufficient documentation

## 2022-01-01 DIAGNOSIS — E785 Hyperlipidemia, unspecified: Secondary | ICD-10-CM

## 2022-01-01 DIAGNOSIS — M810 Age-related osteoporosis without current pathological fracture: Secondary | ICD-10-CM | POA: Diagnosis not present

## 2022-01-01 LAB — BASIC METABOLIC PANEL
BUN: 13 mg/dL (ref 6–23)
CO2: 29 mEq/L (ref 19–32)
Calcium: 9.4 mg/dL (ref 8.4–10.5)
Chloride: 104 mEq/L (ref 96–112)
Creatinine, Ser: 0.76 mg/dL (ref 0.40–1.20)
GFR: 85.11 mL/min (ref >60.00)
Glucose, Bld: 92 mg/dL (ref 70–99)
Potassium: 4.3 mEq/L (ref 3.5–5.1)
Sodium: 140 mEq/L (ref 135–145)

## 2022-01-01 LAB — LIPID PANEL
Cholesterol: 180 mg/dL (ref 0–200)
HDL: 77.4 mg/dL (ref >39.00)
LDL Cholesterol: 90 mg/dL (ref 0–99)
NonHDL: 102.37
Total CHOL/HDL Ratio: 2
Triglycerides: 61 mg/dL (ref 0.0–149.0)
VLDL: 12.2 mg/dL (ref 0.0–40.0)

## 2022-01-01 LAB — VITAMIN D 25 HYDROXY (VIT D DEFICIENCY, FRACTURES): VITD: 45.72 ng/mL (ref 30.00–100.00)

## 2022-01-01 NOTE — Patient Instructions (Signed)

## 2022-01-01 NOTE — Progress Notes (Signed)
Subjective:  Patient ID: Angela Lam, female    DOB: 1960/04/17  Age: 61 y.o. MRN: [id number]  CC: Hyperlipidemia   HPI Angela Lam presents for f/up -  She complains of a several year history of decreased distant vision and would like to see an ophthalmologist.  She is active and works out on a treadmill for 30 minutes every day.  She denies chest pain, shortness of breath, diaphoresis, or edema.  Outpatient Medications Prior to Visit  Medication Sig Dispense Refill   aspirin EC 81 MG tablet Take 81 mg by mouth every other day.     Calcium Carbonate-Vitamin D 600-400 MG-UNIT tablet      cholecalciferol (VITAMIN D3) 25 MCG (1000 UNIT) tablet Take 2,000 Units by mouth daily.     citalopram (CELEXA) 20 MG tablet Take 1 tablet (20 mg total) by mouth in the morning. 90 tablet 1   denosumab (PROLIA) 60 MG/ML SOSY injection INJECT '60MG'$  INTO THE SKIN EVERY 6 MONTHS. 1 mL 1   simvastatin (ZOCOR) 40 MG tablet Take 1 tablet (40 mg total) by mouth at bedtime. 90 tablet 1   traZODone (DESYREL) 50 MG tablet Take 1 tablet (50 mg total) by mouth at bedtime. 90 tablet 1   No facility-administered medications prior to visit.    ROS Review of Systems  Constitutional:  Negative for chills, diaphoresis, fatigue and fever.  HENT: Negative.    Eyes:  Positive for visual disturbance.  Respiratory: Negative.  Negative for cough, chest tightness, shortness of breath and wheezing.   Cardiovascular:  Negative for chest pain, palpitations and leg swelling.  Gastrointestinal:  Negative for abdominal pain, constipation, diarrhea, nausea and vomiting.  Genitourinary: Negative.  Negative for difficulty urinating, dysuria and hematuria.  Musculoskeletal: Negative.  Negative for myalgias.  Skin: Negative.   Neurological: Negative.  Negative for dizziness, weakness and light-headedness.  Hematological:  Negative for adenopathy. Does not bruise/bleed easily.  Psychiatric/Behavioral: Negative.       Objective:  BP 124/76 (BP Location: Left Arm, Patient Position: Sitting, Cuff Size: Large)   Pulse 83   Temp 98.5 F (36.9 C) (Oral)   Ht 5' (1.524 m)   Wt 135 lb (61.2 kg)   SpO2 96%   BMI 26.37 kg/m   BP Readings from Last 3 Encounters:  01/01/22 124/76  11/08/21 108/62  07/21/21 120/79    Wt Readings from Last 3 Encounters:  01/01/22 135 lb (61.2 kg)  11/08/21 135 lb (61.2 kg)  10/11/21 135 lb (61.2 kg)    Physical Exam Vitals reviewed.  Constitutional:      Appearance: Normal appearance.  HENT:     Nose: Nose normal.     Mouth/Throat:     Mouth: Mucous membranes are moist.  Eyes:     General: No scleral icterus.    Conjunctiva/sclera: Conjunctivae normal.  Cardiovascular:     Rate and Rhythm: Normal rate and regular rhythm.     Heart sounds: No murmur heard. Pulmonary:     Effort: Pulmonary effort is normal.     Breath sounds: No stridor. No wheezing, rhonchi or rales.  Abdominal:     General: Abdomen is flat.     Palpations: There is no mass.     Tenderness: There is no abdominal tenderness. There is no guarding.     Hernia: No hernia is present.  Musculoskeletal:        General: Normal range of motion.     Cervical back: Neck supple.  Right lower leg: No edema.     Left lower leg: No edema.  Lymphadenopathy:     Cervical: No cervical adenopathy.  Skin:    General: Skin is warm and dry.  Neurological:     General: No focal deficit present.     Mental Status: She is alert.  Psychiatric:        Mood and Affect: Mood normal.        Behavior: Behavior normal.     Lab Results  Component Value Date   WBC 7.1 07/02/2021   HGB 14.2 07/02/2021   HCT 42.9 07/02/2021   PLT 254.0 07/02/2021   GLUCOSE 92 01/01/2022   CHOL 180 01/01/2022   TRIG 61.0 01/01/2022   HDL 77.40 01/01/2022   LDLCALC 90 01/01/2022   ALT 22 07/02/2021   AST 19 07/02/2021   NA 140 01/01/2022   K 4.3 01/01/2022   CL 104 01/01/2022   CREATININE 0.76 01/01/2022   BUN  13 01/01/2022   CO2 29 01/01/2022   TSH 1.72 06/19/2021    DG Foot Complete Left  Result Date: 07/21/2021 CLINICAL DATA:  Acute LEFT foot pain following injury yesterday. Initial encounter. EXAM: LEFT FOOT - COMPLETE 3 VIEW COMPARISON:  None Available. FINDINGS: There is no evidence of fracture or dislocation. There is no evidence of arthropathy or other focal bone abnormality. Soft tissues are unremarkable. IMPRESSION: Negative. Electronically Signed   By: Margarette Canada M.D.   On: 07/21/2021 10:14    Assessment & Plan:   Angela Lam was seen today for hyperlipidemia.  Diagnoses and all orders for this visit:  Hyperlipidemia with target LDL less than 130- LDL goal achieved. Doing well on the statin  -     Lipid panel; Future -     Lipid panel  Osteoporosis without current pathological fracture, unspecified osteoporosis type- Vitamin D and calcium were normal.  Renal function is normal.  Will continue Prolia every 6 months. -     VITAMIN D 25 Hydroxy (Vit-D Deficiency, Fractures); Future -     Basic metabolic panel; Future -     Basic metabolic panel -     VITAMIN D 25 Hydroxy (Vit-D Deficiency, Fractures)  Decreased visual acuity -     Ambulatory referral to Ophthalmology   I am having Angela Lam maintain her Calcium Carbonate-Vitamin D, aspirin EC, cholecalciferol, citalopram, simvastatin, Prolia, and traZODone.  No orders of the defined types were placed in this encounter.    Follow-up: Return in about 6 months (around 07/02/2022).  Angela Calico, MD

## 2022-01-15 ENCOUNTER — Other Ambulatory Visit (HOSPITAL_COMMUNITY): Payer: Self-pay

## 2022-01-20 NOTE — Telephone Encounter (Signed)
Last Prolia inj 12/20/21 Next Prolia inj due 06/22/22

## 2022-02-25 ENCOUNTER — Other Ambulatory Visit (HOSPITAL_COMMUNITY): Payer: Self-pay

## 2022-02-25 ENCOUNTER — Other Ambulatory Visit: Payer: Self-pay | Admitting: Internal Medicine

## 2022-02-25 MED ORDER — CITALOPRAM HYDROBROMIDE 20 MG PO TABS
20.0000 mg | ORAL_TABLET | Freq: Every morning | ORAL | 1 refills | Status: DC
  Filled 2022-02-25: qty 90, 90d supply, fill #0
  Filled 2022-05-26: qty 90, 90d supply, fill #1

## 2022-03-04 ENCOUNTER — Encounter: Payer: Self-pay | Admitting: Internal Medicine

## 2022-03-05 ENCOUNTER — Ambulatory Visit (INDEPENDENT_AMBULATORY_CARE_PROVIDER_SITE_OTHER): Payer: Commercial Managed Care - PPO | Admitting: Internal Medicine

## 2022-03-05 ENCOUNTER — Other Ambulatory Visit (HOSPITAL_COMMUNITY): Payer: Self-pay

## 2022-03-05 VITALS — BP 108/74 | HR 84 | Temp 98.3°F | Ht 60.0 in | Wt 135.0 lb

## 2022-03-05 DIAGNOSIS — R1030 Lower abdominal pain, unspecified: Secondary | ICD-10-CM | POA: Diagnosis not present

## 2022-03-05 DIAGNOSIS — E785 Hyperlipidemia, unspecified: Secondary | ICD-10-CM

## 2022-03-05 DIAGNOSIS — F411 Generalized anxiety disorder: Secondary | ICD-10-CM | POA: Diagnosis not present

## 2022-03-05 DIAGNOSIS — N6011 Diffuse cystic mastopathy of right breast: Secondary | ICD-10-CM | POA: Insufficient documentation

## 2022-03-05 LAB — URINALYSIS, ROUTINE W REFLEX MICROSCOPIC
Hgb urine dipstick: NEGATIVE
Ketones, ur: NEGATIVE
Nitrite: NEGATIVE
RBC / HPF: NONE SEEN (ref 0–?)
Specific Gravity, Urine: 1.02 (ref 1.000–1.030)
Total Protein, Urine: NEGATIVE
Urine Glucose: NEGATIVE
Urobilinogen, UA: 0.2 (ref 0.0–1.0)
pH: 6 (ref 5.0–8.0)

## 2022-03-05 LAB — HEPATIC FUNCTION PANEL
ALT: 17 U/L (ref 0–35)
AST: 17 U/L (ref 0–37)
Albumin: 3.9 g/dL (ref 3.5–5.2)
Alkaline Phosphatase: 18 U/L — ABNORMAL LOW (ref 39–117)
Bilirubin, Direct: 0.1 mg/dL (ref 0.0–0.3)
Total Bilirubin: 0.3 mg/dL (ref 0.2–1.2)
Total Protein: 6.7 g/dL (ref 6.0–8.3)

## 2022-03-05 LAB — CBC WITH DIFFERENTIAL/PLATELET
Basophils Absolute: 0.1 10*3/uL (ref 0.0–0.1)
Basophils Relative: 1 % (ref 0.0–3.0)
Eosinophils Absolute: 0.1 10*3/uL (ref 0.0–0.7)
Eosinophils Relative: 1.1 % (ref 0.0–5.0)
HCT: 43.7 % (ref 36.0–46.0)
Hemoglobin: 14.6 g/dL (ref 12.0–15.0)
Lymphocytes Relative: 28.4 % (ref 12.0–46.0)
Lymphs Abs: 1.9 10*3/uL (ref 0.7–4.0)
MCHC: 33.5 g/dL (ref 30.0–36.0)
MCV: 100.6 fl — ABNORMAL HIGH (ref 78.0–100.0)
Monocytes Absolute: 0.5 10*3/uL (ref 0.1–1.0)
Monocytes Relative: 7 % (ref 3.0–12.0)
Neutro Abs: 4.3 10*3/uL (ref 1.4–7.7)
Neutrophils Relative %: 62.5 % (ref 43.0–77.0)
Platelets: 353 10*3/uL (ref 150.0–400.0)
RBC: 4.34 Mil/uL (ref 3.87–5.11)
RDW: 12.9 % (ref 11.5–15.5)
WBC: 6.9 10*3/uL (ref 4.0–10.5)

## 2022-03-05 LAB — BASIC METABOLIC PANEL
BUN: 18 mg/dL (ref 6–23)
CO2: 28 mEq/L (ref 19–32)
Calcium: 9.7 mg/dL (ref 8.4–10.5)
Chloride: 104 mEq/L (ref 96–112)
Creatinine, Ser: 0.76 mg/dL (ref 0.40–1.20)
GFR: 85.01 mL/min (ref >60.00)
Glucose, Bld: 88 mg/dL (ref 70–99)
Potassium: 4.2 mEq/L (ref 3.5–5.1)
Sodium: 140 mEq/L (ref 135–145)

## 2022-03-05 LAB — LIPASE: Lipase: 44 U/L (ref 11.0–59.0)

## 2022-03-05 MED ORDER — LINACLOTIDE 145 MCG PO CAPS
145.0000 ug | ORAL_CAPSULE | Freq: Every day | ORAL | 1 refills | Status: DC
  Filled 2022-03-05: qty 30, 30d supply, fill #0

## 2022-03-05 NOTE — Progress Notes (Signed)
Patient ID: Angela Lam, female   DOB: [birth year], 61 y.o.   MRN: [id number]        Chief Complaint: follow up lower abd pain, transient feverish, nausea without vomiting, anxiety       HPI:  Angela Lam is a 62 y.o. female here with husband, c/o onset dec 26 illness characterized by low grade temp to 106, lower abd pain mostly constant, dull and crampy, now in the last 2 days primarily LLQ for some reason, with some bloating off and on with nausea and constipation but no vomiting, diarrhea.  Denies worsening reflux, dysphagia, or blood. Denies urinary symptoms such as dysuria, frequency, urgency, flank pain, hematuria or n/v, fever, chills.  No diarrhea.  Has had lower appetite and feeling weaker overall with less po intake.         Wt Readings from Last 3 Encounters:  03/05/22 135 lb (61.2 kg)  01/01/22 135 lb (61.2 kg)  11/08/21 135 lb (61.2 kg)   BP Readings from Last 3 Encounters:  03/05/22 108/74  01/01/22 124/76  11/08/21 108/62         Past Medical History:  Diagnosis Date   Anxiety    Blood in stool    Chicken pox    Depression    Hyperlipidemia    Osteoporosis    Past Surgical History:  Procedure Laterality Date   BREAST BIOPSY Right 2013   benign   COLOSTOMY     >36yr ago    reports that she has never smoked. She has never been exposed to tobacco smoke. She has never used smokeless tobacco. She reports that she does not currently use alcohol. She reports that she does not use drugs. family history includes Arthritis in her sister; Asthma in her mother and sister; COPD in her mother and sister; Cancer in her father and mother; Depression in her sister; Diabetes in her mother and sister; Early death in her mother; Heart attack in her mother and sister; Heart disease in her father, mother, and sister; Hyperlipidemia in her mother and sister; Hypertension in her sister; Kidney disease in her mother; Miscarriages / Stillbirths in her mother; Osteoporosis in her  maternal grandmother; Stroke in her mother. No Known Allergies Current Outpatient Medications on File Prior to Visit  Medication Sig Dispense Refill   aspirin EC 81 MG tablet Take 81 mg by mouth every other day.     Calcium Carbonate-Vitamin D 600-400 MG-UNIT tablet      cholecalciferol (VITAMIN D3) 25 MCG (1000 UNIT) tablet Take 2,000 Units by mouth daily.     citalopram (CELEXA) 20 MG tablet Take 1 tablet (20 mg total) by mouth in the morning. 90 tablet 1   denosumab (PROLIA) 60 MG/ML SOSY injection INJECT '60MG'$  INTO THE SKIN EVERY 6 MONTHS. 1 mL 1   simvastatin (ZOCOR) 40 MG tablet Take 1 tablet (40 mg total) by mouth at bedtime. 90 tablet 1   traZODone (DESYREL) 50 MG tablet Take 1 tablet (50 mg total) by mouth at bedtime. 90 tablet 1   No current facility-administered medications on file prior to visit.        ROS:  All others reviewed and negative.  Objective        PE:  BP 108/74   Pulse 84   Temp 98.3 F (36.8 C) (Oral)   Ht 5' (1.524 m)   Wt 135 lb (61.2 kg)   SpO2 95%   BMI 26.37 kg/m  Constitutional: Pt appears in NAD               HENT: Head: NCAT.                Right Ear: External ear normal.                 Left Ear: External ear normal.                Eyes: . Pupils are equal, round, and reactive to light. Conjunctivae and EOM are normal               Nose: without d/c or deformity               Neck: Neck supple. Gross normal ROM               Cardiovascular: Normal rate and regular rhythm.                 Pulmonary/Chest: Effort normal and breath sounds without rales or wheezing.                Abd:  Soft, NT, ND, + BS, no organomegaly, benign exam               Neurological: Pt is alert. At baseline orientation, motor grossly intact               Skin: Skin is warm. No rashes, no other new lesions, LE edema - none               Psychiatric: Pt behavior is normal without agitation , mod nervous  Micro: none  Cardiac tracings I have  personally interpreted today:  none  Pertinent Radiological findings (summarize): none   Lab Results  Component Value Date   WBC 6.9 03/05/2022   HGB 14.6 03/05/2022   HCT 43.7 03/05/2022   PLT 353.0 03/05/2022   GLUCOSE 88 03/05/2022   CHOL 180 01/01/2022   TRIG 61.0 01/01/2022   HDL 77.40 01/01/2022   LDLCALC 90 01/01/2022   ALT 17 03/05/2022   AST 17 03/05/2022   NA 140 03/05/2022   K 4.2 03/05/2022   CL 104 03/05/2022   CREATININE 0.76 03/05/2022   BUN 18 03/05/2022   CO2 28 03/05/2022   TSH 1.72 06/19/2021   Assessment/Plan:  Angela Lam is a 62 y.o. White or Caucasian [1] female with  has a past medical history of Anxiety, Blood in stool, Chicken pox, Depression, Hyperlipidemia, and Osteoporosis.  Abdominal pain, lower Etiology unclear, may have had low grade temp at beginning illness, now mostly constipation it seems, pt currently afeb, non toxic, exam benign - for lab including cbc, linzess prn, and hold on CT imaging for now  Hyperlipidemia with target LDL less than 130 Lab Results  Component Value Date   LDLCALC 90 01/01/2022   Stable, pt to continue current statin zocor 40 mg - doubt related to current constipation   GAD (generalized anxiety disorder) Mild to mod, likely situational,   to f/u any worsening symptoms or concerns, declines need for further change in tx  Followup: Return if symptoms worsen or fail to improve.  Cathlean Cower, MD 03/08/2022 8:01 PM [facility]

## 2022-03-05 NOTE — Patient Instructions (Signed)
Please take all new medication as prescribed - the linzess as needed for constipation  Please continue all other medications as before, and refills have been done if requested.  Please have the pharmacy call with any other refills you may need.  Please keep your appointments with your specialists as you may have planned  I think we can hold on the CT scan for now  Please go to the LAB at the blood drawing area for the tests to be done  You will be contacted by phone if any changes need to be made immediately.  Otherwise, you will receive a letter about your results with an explanation, but please check with MyChart first.  Please remember to sign up for MyChart if you have not done so, as this will be important to you in the future with finding out test results, communicating by private email, and scheduling acute appointments online when needed.

## 2022-03-08 ENCOUNTER — Encounter: Payer: Self-pay | Admitting: Internal Medicine

## 2022-03-08 DIAGNOSIS — R1030 Lower abdominal pain, unspecified: Secondary | ICD-10-CM | POA: Insufficient documentation

## 2022-03-08 NOTE — Assessment & Plan Note (Signed)
Mild to mod, likely situational,   to f/u any worsening symptoms or concerns, declines need for further change in tx

## 2022-03-08 NOTE — Assessment & Plan Note (Signed)
Lab Results  Component Value Date   LDLCALC 90 01/01/2022   Stable, pt to continue current statin zocor 40 mg - doubt related to current constipation

## 2022-03-08 NOTE — Assessment & Plan Note (Signed)
Etiology unclear, may have had low grade temp at beginning illness, now mostly constipation it seems, pt currently afeb, non toxic, exam benign - for lab including cbc, linzess prn, and hold on CT imaging for now

## 2022-03-17 ENCOUNTER — Encounter: Payer: Self-pay | Admitting: Internal Medicine

## 2022-03-25 ENCOUNTER — Other Ambulatory Visit (HOSPITAL_COMMUNITY): Payer: Self-pay

## 2022-03-27 DIAGNOSIS — H524 Presbyopia: Secondary | ICD-10-CM | POA: Diagnosis not present

## 2022-03-27 DIAGNOSIS — H5213 Myopia, bilateral: Secondary | ICD-10-CM | POA: Diagnosis not present

## 2022-04-08 ENCOUNTER — Other Ambulatory Visit (HOSPITAL_COMMUNITY): Payer: Self-pay

## 2022-04-13 ENCOUNTER — Other Ambulatory Visit: Payer: Self-pay | Admitting: Internal Medicine

## 2022-04-13 MED ORDER — SIMVASTATIN 40 MG PO TABS
40.0000 mg | ORAL_TABLET | Freq: Every day | ORAL | 1 refills | Status: DC
  Filled 2022-04-13: qty 90, 90d supply, fill #0
  Filled 2022-07-14: qty 90, 90d supply, fill #1

## 2022-04-14 ENCOUNTER — Other Ambulatory Visit (HOSPITAL_COMMUNITY): Payer: Self-pay

## 2022-04-14 ENCOUNTER — Other Ambulatory Visit: Payer: Self-pay

## 2022-05-05 NOTE — Telephone Encounter (Signed)
Insurance updated to [employer].  Prolia VOB initiated via [email or web address]  Last OV:  Next OV:  Last Prolia inj: 12/20/21 Next Prolia inj DUE: 06/22/22

## 2022-05-17 NOTE — Telephone Encounter (Signed)
Prior Authorization initiated for [employer] Case ID: [id number]

## 2022-05-17 NOTE — Telephone Encounter (Signed)
ELIGIBLE FOR COPAY ENROLLMENT

## 2022-05-17 NOTE — Telephone Encounter (Signed)
Prior auth required for PROLIA  PA PROCESS DETAILS: Precertification is required. Call [phone] or complete the Precertification form available at [email or web address] 

## 2022-05-22 NOTE — Telephone Encounter (Addendum)
APPROVED  PA# [id number] Valid: 05/17/22-05/17/23

## 2022-05-26 ENCOUNTER — Other Ambulatory Visit (HOSPITAL_COMMUNITY): Payer: Self-pay

## 2022-05-26 NOTE — Telephone Encounter (Signed)
Pt ready for scheduling on or after 06/22/22  Out-of-pocket cost due at time of visit: $448.06 (Remaining deductible $398.06 + $50 copay)  Primary: [employer] PPO Prolia co-insurance: $50 Admin fee co-insurance: n/a   Deductible: $101.94 of $500 met  Prior Auth: APPROVED PA# [id number] Valid: 05/17/22-05/17/23  Secondary: [employer] co-insurance:  Admin fee co-insurance:   Deductible: n/a  Prior Auth: n/a PA# Valid:     ** This summary of benefits is an estimation of the patient's out-of-pocket cost. Exact cost may very based on individual plan coverage.

## 2022-05-27 ENCOUNTER — Other Ambulatory Visit: Payer: Self-pay

## 2022-05-27 ENCOUNTER — Other Ambulatory Visit (HOSPITAL_COMMUNITY): Payer: Self-pay

## 2022-05-28 ENCOUNTER — Other Ambulatory Visit (HOSPITAL_COMMUNITY): Payer: Self-pay

## 2022-05-29 ENCOUNTER — Other Ambulatory Visit (HOSPITAL_COMMUNITY): Payer: Self-pay

## 2022-06-07 ENCOUNTER — Other Ambulatory Visit: Payer: Self-pay | Admitting: Internal Medicine

## 2022-06-08 MED ORDER — CITALOPRAM HYDROBROMIDE 20 MG PO TABS
20.0000 mg | ORAL_TABLET | Freq: Every morning | ORAL | 1 refills | Status: DC
  Filled 2022-06-08 – 2022-08-24 (×2): qty 90, 90d supply, fill #0
  Filled 2022-11-24: qty 90, 90d supply, fill #1

## 2022-06-09 ENCOUNTER — Other Ambulatory Visit: Payer: Self-pay

## 2022-06-09 ENCOUNTER — Other Ambulatory Visit (HOSPITAL_COMMUNITY): Payer: Self-pay

## 2022-06-13 ENCOUNTER — Ambulatory Visit: Payer: Commercial Managed Care - PPO | Attending: Internal Medicine | Admitting: Pharmacist

## 2022-06-13 DIAGNOSIS — Z79899 Other long term (current) drug therapy: Secondary | ICD-10-CM

## 2022-06-13 NOTE — Progress Notes (Signed)
S:  Patient presents for review of their specialty medication therapy.  Patient is on Prolia for osteoporosis. Patient is managed by Dr. Lonzo Cloud for this. Last BMD on 01/17/21.  Adherence: confirms  Efficacy: reports good results with the medication   Dosing:  q25months  Dose adjustments: Renal: Monitor patients with severe impairment (CrCl <30 mL/minute or on dialysis) closely, as significant and prolonged hypocalcemia (incidence of 29% and potentially lasting weeks to months) and marked elevations of serum parathyroid hormone are serious risks in this population. Ensure adequate calcium and vitamin D intake/supplementation. CrCl ?30 mL/minute: No dosage adjustment necessary. CrCl <30 mL/minute: No dosage adjustment necessary; use in conjunction with guidance from patient's nephrology team. Hepatic: no dose adjustments (has not been studied)  Drug-drug interactions: none identified   Screening: TB test: completed  Hepatitis: completed   Monitoring: S/sx of infection: none  S/sx of hypersensitivity: none  S/sx of hypocalcemia/hypercalcemia: none  Dermatitis/skin rash: none  Peripheral edema: none  HA: none  GI upset: none   Other side effects:  -Endorses hair loss since starting the Prolia. Plans to discuss this with her specialist but will remain on the medication for now.   Last bone density study: 01/2021   O:      Lab Results  Component Value Date   WBC 6.9 03/05/2022   HGB 14.6 03/05/2022   HCT 43.7 03/05/2022   MCV 100.6 (H) 03/05/2022   PLT 353.0 03/05/2022      Chemistry      Component Value Date/Time   NA 140 03/05/2022 1010   NA 141 05/26/2019 0000   K 4.2 03/05/2022 1010   CL 104 03/05/2022 1010   CO2 28 03/05/2022 1010   BUN 18 03/05/2022 1010   BUN 17 05/26/2019 0000   CREATININE 0.76 03/05/2022 1010   GLU 75 05/26/2019 0000      Component Value Date/Time   CALCIUM 9.7 03/05/2022 1010   ALKPHOS 18 (L) 03/05/2022 1010   AST 17  03/05/2022 1010   ALT 17 03/05/2022 1010   BILITOT 0.3 03/05/2022 1010       A/P: 1. Medication review: Patient is taking Prolia for osteoporosis. Reviewed the medication with the patient, including the following: Prolia (denosumab) is a monoclonal antibody with affinity for nuclear factor-kappa ligand (RANKL). Prolia binds to RANKL and prevents osteoclast formation, leading to decreased bone resorption and increased bone mass in osteoporosis. Patient educated on purpose, proper use, and potential adverse effects of Prolia. The most common adverse effects are hypersensitivities, peripheral edema, dermatitis/skin rash, GI upset, HA, joint pain, and infection. There is the possibility of atypical femur fracture, serum calcium disturbances, and osteonecrosis of the jaw. Patients should monitor for and report hip, thigh, or groin pain. Additionally, patients should monitor for and report jaw pain, tooth/periodontal infection, toothache, and/or gingival ulceration/erosion. Prolia exists as a solution prefilled syringe for SQ administration. Administration: Denosumab is intended for SubQ route only and should not be administered IV, IM, or intradermally. Prior to administration, bring to room temperature in original container (allow to stand ~15 to 30 minutes); do not warm by any other method. Solution may contain trace amounts of translucent to white protein particles; do not use if cloudy, discolored (normal solution should be clear and colorless to pale yellow), or contains excessive particles or foreign matter. Avoid vigorous shaking. Administer via SubQ injection in the upper arm, upper thigh, or abdomen; should only be administered by a health care professional. No recommendations for any changes at  this time.   Butch Penny, PharmD, Patsy [place], CPP Clinical Pharmacist [facility] [phone]

## 2022-06-18 ENCOUNTER — Other Ambulatory Visit: Payer: Self-pay | Admitting: Internal Medicine

## 2022-06-18 ENCOUNTER — Other Ambulatory Visit (HOSPITAL_COMMUNITY): Payer: Self-pay

## 2022-06-18 DIAGNOSIS — F411 Generalized anxiety disorder: Secondary | ICD-10-CM

## 2022-06-18 MED ORDER — TRAZODONE HCL 50 MG PO TABS
50.0000 mg | ORAL_TABLET | Freq: Every day | ORAL | 1 refills | Status: DC
  Filled 2022-06-18: qty 90, 90d supply, fill #0
  Filled 2022-09-18: qty 90, 90d supply, fill #1

## 2022-06-19 ENCOUNTER — Other Ambulatory Visit: Payer: Self-pay

## 2022-06-20 ENCOUNTER — Other Ambulatory Visit (HOSPITAL_COMMUNITY): Payer: Self-pay

## 2022-06-20 ENCOUNTER — Ambulatory Visit: Payer: 59 | Admitting: Internal Medicine

## 2022-06-23 ENCOUNTER — Other Ambulatory Visit (HOSPITAL_COMMUNITY): Payer: Self-pay

## 2022-06-23 ENCOUNTER — Other Ambulatory Visit: Payer: Self-pay

## 2022-06-27 ENCOUNTER — Encounter: Payer: Self-pay | Admitting: Internal Medicine

## 2022-06-27 ENCOUNTER — Ambulatory Visit: Payer: Commercial Managed Care - PPO | Admitting: Internal Medicine

## 2022-06-27 VITALS — BP 120/70 | HR 77 | Ht 60.0 in | Wt 137.0 lb

## 2022-06-27 DIAGNOSIS — M810 Age-related osteoporosis without current pathological fracture: Secondary | ICD-10-CM

## 2022-06-27 MED ORDER — DENOSUMAB 60 MG/ML ~~LOC~~ SOSY
60.0000 mg | PREFILLED_SYRINGE | Freq: Once | SUBCUTANEOUS | Status: AC
  Administered 2022-06-27: 60 mg via SUBCUTANEOUS

## 2022-06-27 NOTE — Progress Notes (Signed)
Name: Angela Lam  MRN/ DOB: [id number], [birth year]    Age/ Sex: 62 y.o., female     PCP: Etta Grandchild, MD   Reason for Endocrinology Evaluation: Osteoporosis     Initial Endocrinology Clinic Visit: 02/06/2020    PATIENT IDENTIFIER: Angela Lam is a 62 y.o., female with a past medical history of osteoporosis. She has followed with [facility] since 02/06/2020 for consultative assistance with management of her osteoporosis.   HISTORICAL SUMMARY:   Dx'ed: 2016 Secondary cause: none Fractures: left little toe (2018--traumatic) Past rx: Actonel 2019-2020; Tymlos 2020-2021.  Rx: Started Fosamax 2020, started Prolia 2022.    She was followed by Dr. Everardo All from 2021 until 06/2021, she was on Prolia and alendronate at the time  DXA scan in 2019 which was done at a different facility showed a T-score of -2.9 at the AP spine.  The scans were not legible for the hips and the forearm, as that digits were not clear if it was a -2 or and -3.  DXA scan in 2021 showed improvement in BMD DXA scan in 2022 was done at a different location, hence unable to compare prior with osteopenia   She was advised to discontinue alendronate and just continue with Prolia by 06/2022  SUBJECTIVE:    Today (06/27/2022):  Angela Lam is here for a follow-up on osteoporosis   Her last Prolia injection was 12/20/2021 Has noted hair loss that she attributes to Prolia for ~ 6 months.  Denies recent fall  No fractures  Denies heartburn or constipation  No personal heart disease No personal hx of CVA   Calcium 600 mg daily  Vitamin D 1000 iu  daily   HISTORY:  Past Medical History:  Past Medical History:  Diagnosis Date   Anxiety    Blood in stool    Chicken pox    Depression    Hyperlipidemia    Osteoporosis    Past Surgical History:  Past Surgical History:  Procedure Laterality Date   BREAST BIOPSY Right 2013   benign   COLOSTOMY     >82yrs ago   Social  History:  reports that she has never smoked. She has never been exposed to tobacco smoke. She has never used smokeless tobacco. She reports that she does not currently use alcohol. She reports that she does not use drugs. Family History:  Family History  Problem Relation Age of Onset   Asthma Mother    Cancer Mother    COPD Mother    Diabetes Mother    Early death Mother    Heart disease Mother    Hyperlipidemia Mother    Kidney disease Mother    Miscarriages / [place] Mother    Stroke Mother    Heart attack Mother    Cancer Father    Heart disease Father    Arthritis Sister    Asthma Sister    COPD Sister    Depression Sister    Diabetes Sister    Heart disease Sister    Hyperlipidemia Sister    Hypertension Sister    Heart attack Sister    Osteoporosis Maternal Grandmother    Breast cancer Neg Hx    Colon cancer Neg Hx    Colon polyps Neg Hx    Esophageal cancer Neg Hx    Rectal cancer Neg Hx    Stomach cancer Neg Hx      HOME MEDICATIONS: Allergies as of 06/27/2022   No Known Allergies  Medication List        Accurate as of June 27, 2022  1:50 PM. If you have any questions, ask your nurse or doctor.          STOP taking these medications    Linzess 145 MCG Caps capsule Generic drug: linaclotide Stopped by: Scarlette Shorts, MD       TAKE these medications    aspirin EC 81 MG tablet Take 81 mg by mouth every other day.   Calcium Carbonate-Vitamin D 600-400 MG-UNIT tablet   cholecalciferol 25 MCG (1000 UNIT) tablet Commonly known as: VITAMIN D3 Take 2,000 Units by mouth daily.   citalopram 20 MG tablet Commonly known as: CELEXA Take 1 tablet (20 mg total) by mouth in the morning.   Prolia 60 MG/ML Sosy injection Generic drug: denosumab INJECT 60MG  INTO THE SKIN EVERY 6 MONTHS.   simvastatin 40 MG tablet Commonly known as: ZOCOR Take 1 tablet (40 mg total) by mouth at bedtime.   traZODone 50 MG tablet Commonly known as:  DESYREL Take 1 tablet (50 mg total) by mouth at bedtime.          OBJECTIVE:   PHYSICAL EXAM: VS: BP 120/70 (BP Location: Left Arm, Patient Position: Sitting, Cuff Size: Small)   Pulse 77   Ht 5' (1.524 m)   Wt 137 lb (62.1 kg)   SpO2 99%   BMI 26.76 kg/m    EXAM: General: Pt appears well and is in NAD  Eyes: External eye exam normal without stare, lid lag or exophthalmos.  EOM intact.    Neck: General: Supple without adenopathy. Thyroid: Thyroid size normal.  No goiter or nodules appreciated. No thyroid bruit.  Lungs: Clear with good BS bilat with no rales, rhonchi, or wheezes  Heart: Auscultation: RRR.  Abdomen: Normoactive bowel sounds, soft, nontender, without masses or organomegaly palpable  Extremities:  BL LE: No pretibial edema normal ROM and strength.  Mental Status: Judgment, insight: Intact Orientation: Oriented to time, place, and person Mood and affect: No depression, anxiety, or agitation     DATA REVIEWED:   Latest Reference Range & Units 03/05/22 10:10  Sodium 135 - 145 mEq/L 140  Potassium 3.5 - 5.1 mEq/L 4.2  Chloride 96 - 112 mEq/L 104  CO2 19 - 32 mEq/L 28  Glucose 70 - 99 mg/dL 88  BUN 6 - 23 mg/dL 18  Creatinine 1.61 - 0.96 mg/dL 0.45  Calcium 8.4 - 40.9 mg/dL 9.7  Alkaline Phosphatase 39 - 117 U/L 18 (L)  Albumin 3.5 - 5.2 g/dL 3.9  Lipase 81.1 - 91.4 U/L 44.0  AST 0 - 37 U/L 17  ALT 0 - 35 U/L 17  Total Protein 6.0 - 8.3 g/dL 6.7  Bilirubin, Direct 0.0 - 0.3 mg/dL 0.1  Total Bilirubin 0.2 - 1.2 mg/dL 0.3  GFR >78.29 mL/min 85.01  (L): Data is abnormally low  DXA 01/17/2021     Lumbar spine L1-L4 Femoral neck (FN)  T-score -0.5 RFN: -2.0 LFN: -1.8  Change in BMD from previous DXA test (%) n/a n/a        Old records , labs and images have been reviewed.   ASSESSMENT / PLAN / RECOMMENDATIONS:   Osteoporosis:  - Last DXA scan did not show any evidence of osteoporosis  -Discussed the importance of optimizing calcium and  vitamin D intake, patient advised to increase calcium to twice daily -She has been on Tymlos and Actonel in the past, she noted dramatic improvement with  Tymlos -She was on alendronate in conjunction with Prolia , but alendronate was discontinued by 06/2022 -She does endorse hair loss over the past 6 months, that she attributes to Prolia, this is mild and not distressing but she was wondering if she could discontinue -We discussed the importance of a bridging medication when getting off Prolia, she has responded clinically well to Prolia, she did receive a Prolia injection today, she was advised to start hair/skin/nail vitamins and monitor hair loss over the next 4-5 months.  -I have opted to restart her on alendronate for 1 year, should she decide to discontinue Prolia -We also discussed Evenity, but I prefer to wait on this since her last bone density was good with a T-score of -2.0  Medications  Increase calcium 600 mg twice daily Continue vitamin D 1000 IU daily Prolia 60 mg every 6 months   Follow-up in 1 year   I spent 27 minutes preparing to see the patient by review of recent labs, imaging and procedures, obtaining and reviewing separately obtained history, communicating with the patient ordering medications, and documenting clinical information in the EHR including the differential Dx, treatment, and any further evaluation and other management   Signed electronically by: Lyndle Herrlich, MD  [facility]  [facility] [address] [place] [place], [place] [address] Phone: [phone] FAX: [phone]      CC: Etta Grandchild, MD [address] [place] [address] Phone: [phone]  Fax: [phone]   Return to Endocrinology clinic as below: Future Appointments  Date Time Provider Department Center  07/02/2022  8:20 AM Etta Grandchild, MD [facility] None  06/29/2023  7:30 AM Yoandri Congrove, Konrad Dolores, MD [facility] None

## 2022-06-27 NOTE — Patient Instructions (Signed)
Increase Calcium to twice daily  Continue Vitamin D 1000 iu daily     Start " Hair/skin/nail" Vitamin to see if that helps with hair loss   If you want to discontinue Prolia , let me know before the 6 months and will restart Fosamax for a year

## 2022-06-27 NOTE — Progress Notes (Signed)
After obtaining consent, and per orders of Dr. Lonzo Cloud, injection of Prolia 6omg given by Greene Diodato L Karishma Unrein in right arm SQ. Patient instructed to remain in clinic for 20 minutes afterwards, and to report any adverse reaction to me immediately.

## 2022-07-01 ENCOUNTER — Ambulatory Visit: Payer: Commercial Managed Care - PPO

## 2022-07-02 ENCOUNTER — Encounter: Payer: Self-pay | Admitting: Internal Medicine

## 2022-07-02 ENCOUNTER — Ambulatory Visit: Payer: Commercial Managed Care - PPO | Admitting: Internal Medicine

## 2022-07-02 VITALS — BP 122/72 | HR 80 | Temp 98.2°F | Resp 16 | Ht 60.0 in | Wt 137.0 lb

## 2022-07-02 DIAGNOSIS — Z136 Encounter for screening for cardiovascular disorders: Secondary | ICD-10-CM | POA: Diagnosis not present

## 2022-07-02 DIAGNOSIS — E785 Hyperlipidemia, unspecified: Secondary | ICD-10-CM

## 2022-07-02 DIAGNOSIS — Z8249 Family history of ischemic heart disease and other diseases of the circulatory system: Secondary | ICD-10-CM

## 2022-07-02 DIAGNOSIS — L989 Disorder of the skin and subcutaneous tissue, unspecified: Secondary | ICD-10-CM

## 2022-07-02 DIAGNOSIS — Z1231 Encounter for screening mammogram for malignant neoplasm of breast: Secondary | ICD-10-CM | POA: Insufficient documentation

## 2022-07-02 DIAGNOSIS — Z124 Encounter for screening for malignant neoplasm of cervix: Secondary | ICD-10-CM

## 2022-07-02 NOTE — Progress Notes (Signed)
Subjective:  Patient ID: Angela Lam, female    DOB: October 08, 1960  Age: 62 y.o. MRN: [id number]  CC: Hyperlipidemia   HPI Angela Lam presents for f/up ---  She has a family history of premature coronary artery disease and would like to be screened.  She is active and denies chest pain, shortness of breath, diaphoresis, or edema.  Outpatient Medications Prior to Visit  Medication Sig Dispense Refill   aspirin EC 81 MG tablet Take 81 mg by mouth every other day.     Calcium Carbonate-Vitamin D 600-400 MG-UNIT tablet      cholecalciferol (VITAMIN D3) 25 MCG (1000 UNIT) tablet Take 2,000 Units by mouth daily.     citalopram (CELEXA) 20 MG tablet Take 1 tablet (20 mg total) by mouth in the morning. 90 tablet 1   denosumab (PROLIA) 60 MG/ML SOSY injection INJECT 60MG  INTO THE SKIN EVERY 6 MONTHS. 1 mL 1   simvastatin (ZOCOR) 40 MG tablet Take 1 tablet (40 mg total) by mouth at bedtime. 90 tablet 1   traZODone (DESYREL) 50 MG tablet Take 1 tablet (50 mg total) by mouth at bedtime. 90 tablet 1   No facility-administered medications prior to visit.    ROS Review of Systems  Constitutional: Negative.  Negative for chills, diaphoresis, fatigue and fever.  HENT: Negative.    Eyes: Negative.   Respiratory:  Negative for cough, chest tightness, shortness of breath and wheezing.   Cardiovascular:  Negative for chest pain, palpitations and leg swelling.  Gastrointestinal:  Negative for abdominal pain, diarrhea, nausea and vomiting.  Endocrine: Negative.   Genitourinary: Negative.  Negative for difficulty urinating.  Musculoskeletal: Negative.  Negative for arthralgias, joint swelling and myalgias.  Skin: Negative.   Neurological:  Negative for dizziness, weakness and light-headedness.  Hematological:  Negative for adenopathy. Does not bruise/bleed easily.  Psychiatric/Behavioral: Negative.      Objective:  BP 122/72 (BP Location: Left Arm, Patient Position: Sitting, Cuff Size:  Large)   Pulse 80   Temp 98.2 F (36.8 C) (Oral)   Resp 16   Ht 5' (1.524 m)   Wt 137 lb (62.1 kg)   SpO2 95%   BMI 26.76 kg/m   BP Readings from Last 3 Encounters:  07/02/22 122/72  06/27/22 120/70  03/05/22 108/74    Wt Readings from Last 3 Encounters:  07/02/22 137 lb (62.1 kg)  06/27/22 137 lb (62.1 kg)  03/05/22 135 lb (61.2 kg)    Physical Exam Vitals reviewed.  Constitutional:      Appearance: Normal appearance.  HENT:     Mouth/Throat:     Mouth: Mucous membranes are moist.  Eyes:     General: No scleral icterus.    Conjunctiva/sclera: Conjunctivae normal.  Cardiovascular:     Rate and Rhythm: Normal rate and regular rhythm.     Heart sounds: Normal heart sounds, S1 normal and S2 normal. No murmur heard.    No gallop.     Comments: EKG--- NSR, 75 bpm Normal EKG Pulmonary:     Effort: Pulmonary effort is normal.     Breath sounds: No stridor. No wheezing, rhonchi or rales.  Abdominal:     Palpations: There is no mass.     Tenderness: There is no abdominal tenderness. There is no guarding.     Hernia: No hernia is present.  Musculoskeletal:     Cervical back: Neck supple.     Right lower leg: No edema.     Left lower leg:  No edema.  Lymphadenopathy:     Cervical: No cervical adenopathy.  Skin:    General: Skin is warm and dry.  Neurological:     General: No focal deficit present.     Mental Status: She is alert. Mental status is at baseline.  Psychiatric:        Mood and Affect: Mood normal.        Behavior: Behavior normal.     Lab Results  Component Value Date   WBC 6.9 03/05/2022   HGB 14.6 03/05/2022   HCT 43.7 03/05/2022   PLT 353.0 03/05/2022   GLUCOSE 88 03/05/2022   CHOL 180 01/01/2022   TRIG 61.0 01/01/2022   HDL 77.40 01/01/2022   LDLCALC 90 01/01/2022   ALT 17 03/05/2022   AST 17 03/05/2022   NA 140 03/05/2022   K 4.2 03/05/2022   CL 104 03/05/2022   CREATININE 0.76 03/05/2022   BUN 18 03/05/2022   CO2 28 03/05/2022    TSH 1.72 06/19/2021    DG Foot Complete Left  Result Date: 07/21/2021 CLINICAL DATA:  Acute LEFT foot pain following injury yesterday. Initial encounter. EXAM: LEFT FOOT - COMPLETE 3 VIEW COMPARISON:  None Available. FINDINGS: There is no evidence of fracture or dislocation. There is no evidence of arthropathy or other focal bone abnormality. Soft tissues are unremarkable. IMPRESSION: Negative. Electronically Signed   By: Harmon Pier M.D.   On: 07/21/2021 10:14    Assessment & Plan:   Encounter for screening for coronary artery disease- Her EKG is normal.  Will screen for CAD with a coronary calcium score. -     EKG 12-Lead -     CT CARDIAC SCORING (SELF PAY ONLY); Future  Screening mammogram for breast cancer -     Digital Screening Mammogram, Left and Right; Future  Cervical cancer screening -     Ambulatory referral to Gynecology  Lesion of skin of face -     Ambulatory referral to Dermatology  Family history of premature CAD -     CT CARDIAC SCORING (SELF PAY ONLY); Future  Hyperlipidemia with target LDL less than 130- LDL goal achieved. Doing well on the statin  -     CT CARDIAC SCORING (SELF PAY ONLY); Future     Follow-up: No follow-ups on file.  Sanda Linger, MD

## 2022-07-08 NOTE — Telephone Encounter (Signed)
Last Prolia inj 06/27/22 Next Prolia inj due 12/28/22

## 2022-07-14 ENCOUNTER — Other Ambulatory Visit (HOSPITAL_COMMUNITY): Payer: Self-pay

## 2022-08-01 ENCOUNTER — Ambulatory Visit
Admission: RE | Admit: 2022-08-01 | Discharge: 2022-08-01 | Disposition: A | Discharge status: Home / Self Care | Payer: Commercial Managed Care - PPO | Source: Ambulatory Visit | Attending: Internal Medicine | Admitting: Internal Medicine

## 2022-08-01 DIAGNOSIS — Z1231 Encounter for screening mammogram for malignant neoplasm of breast: Secondary | ICD-10-CM | POA: Diagnosis not present

## 2022-08-24 ENCOUNTER — Other Ambulatory Visit (HOSPITAL_COMMUNITY): Payer: Self-pay

## 2022-08-25 ENCOUNTER — Other Ambulatory Visit (HOSPITAL_COMMUNITY): Payer: Self-pay

## 2022-08-28 ENCOUNTER — Other Ambulatory Visit (HOSPITAL_COMMUNITY): Payer: Self-pay

## 2022-09-03 ENCOUNTER — Ambulatory Visit (HOSPITAL_COMMUNITY)
Admission: RE | Admit: 2022-09-03 | Discharge: 2022-09-03 | Disposition: A | Discharge status: Home / Self Care | Payer: Commercial Managed Care - PPO | Source: Ambulatory Visit | Attending: Internal Medicine | Admitting: Internal Medicine

## 2022-09-03 DIAGNOSIS — E785 Hyperlipidemia, unspecified: Secondary | ICD-10-CM | POA: Insufficient documentation

## 2022-09-03 DIAGNOSIS — Z136 Encounter for screening for cardiovascular disorders: Secondary | ICD-10-CM | POA: Insufficient documentation

## 2022-09-03 DIAGNOSIS — Z8249 Family history of ischemic heart disease and other diseases of the circulatory system: Secondary | ICD-10-CM | POA: Insufficient documentation

## 2022-09-10 ENCOUNTER — Ambulatory Visit: Payer: Commercial Managed Care - PPO | Admitting: Dermatology

## 2022-09-16 ENCOUNTER — Ambulatory Visit (INDEPENDENT_AMBULATORY_CARE_PROVIDER_SITE_OTHER): Payer: Commercial Managed Care - PPO | Admitting: Radiology

## 2022-09-16 ENCOUNTER — Encounter: Payer: Self-pay | Admitting: Radiology

## 2022-09-16 ENCOUNTER — Other Ambulatory Visit (HOSPITAL_COMMUNITY)
Admission: RE | Admit: 2022-09-16 | Discharge: 2022-09-16 | Disposition: A | Discharge status: Home / Self Care | Payer: Commercial Managed Care - PPO | Source: Ambulatory Visit | Attending: Radiology | Admitting: Radiology

## 2022-09-16 VITALS — BP 116/74 | Ht 60.0 in | Wt 135.0 lb

## 2022-09-16 DIAGNOSIS — Z01419 Encounter for gynecological examination (general) (routine) without abnormal findings: Secondary | ICD-10-CM | POA: Insufficient documentation

## 2022-09-16 NOTE — Progress Notes (Signed)
   Angela Lam 11-Oct-1960 [id number]   History: Postmenopausal 62 y.o. presents for annual exam as a new patient. Moved from [place] 3 years ago. No gyn concerns.   Gynecologic History Postmenopausal Last Pap: 2021. Results were: normal, elects for pap this year Last mammogram: 2024. Results were: normal Last colonoscopy: 2023 DEXA:2022 osteoporosis, on prolia HRT use: no  Obstetric History OB History  Gravida Para Term Preterm AB Living  0 0 0 0 0 0  SAB IAB Ectopic Multiple Live Births  0 0 0 0 0     The following portions of the patient's history were reviewed and updated as appropriate: allergies, current medications, past family history, past medical history, past social history, past surgical history, and problem list.  Review of Systems Pertinent items noted in HPI and remainder of comprehensive ROS otherwise negative.  Past medical history, past surgical history, family history and social history were all reviewed and documented in the EPIC chart.  Exam:  Vitals:   09/16/22 1040  BP: 116/74  Weight: 135 lb (61.2 kg)  Height: 5' (1.524 m)   Body mass index is 26.37 kg/m.  General appearance:  Normal Thyroid:  Symmetrical, normal in size, without palpable masses or nodularity. Respiratory  Auscultation:  Clear without wheezing or rhonchi Cardiovascular  Auscultation:  Regular rate, without rubs, murmurs or gallops  Edema/varicosities:  Not grossly evident Abdominal  Soft,nontender, without masses, guarding or rebound.  Liver/spleen:  No organomegaly noted  Hernia:  None appreciated  Skin  Inspection:  Grossly normal Breasts: Examined lying and sitting.   Right: Without masses, retractions, nipple discharge or axillary adenopathy.   Left: Without masses, retractions, nipple discharge or axillary adenopathy. Genitourinary   Inguinal/mons:  Normal without inguinal adenopathy  External genitalia:  Normal appearing vulva with no masses, tenderness,  or lesions  BUS/Urethra/Skene's glands:  Normal  Vagina:  Normal appearing with normal color and discharge, no lesions. Atrophy: moderate   Cervix:  Normal appearing without discharge or lesions  Uterus:  Normal in size, shape and contour.  Midline and mobile, nontender  Adnexa/parametria:     Rt: Normal in size, without masses or tenderness.   Lt: Normal in size, without masses or tenderness.  Anus and perineum: Normal    Raynelle Fanning, CMA present for exam  Assessment/Plan:   1. Well female exam with routine gynecological exam Up to date on screenings Has PCP Osteoporosis managed by endocrinology - Cytology - PAP( [facility])    Discussed SBE, colonoscopy and DEXA screening as directed. Recommend of exercise weekly, including weight bearing exercise. Encouraged the use of seatbelts and sunscreen.  Return in 1 year for annual or sooner prn.  Tanda Rockers WHNP-BC, 10:55 AM 09/16/2022

## 2022-09-17 LAB — CYTOLOGY - PAP
Comment: NEGATIVE
Diagnosis: NEGATIVE
High risk HPV: NEGATIVE

## 2022-09-18 ENCOUNTER — Other Ambulatory Visit (HOSPITAL_COMMUNITY): Payer: Self-pay

## 2022-09-19 ENCOUNTER — Other Ambulatory Visit (HOSPITAL_COMMUNITY): Payer: Self-pay

## 2022-09-22 ENCOUNTER — Other Ambulatory Visit (HOSPITAL_COMMUNITY): Payer: Self-pay

## 2022-09-23 ENCOUNTER — Other Ambulatory Visit (HOSPITAL_COMMUNITY): Payer: Self-pay

## 2022-10-07 ENCOUNTER — Other Ambulatory Visit: Payer: Self-pay | Admitting: Internal Medicine

## 2022-10-07 ENCOUNTER — Other Ambulatory Visit (HOSPITAL_COMMUNITY): Payer: Self-pay

## 2022-10-07 MED ORDER — SIMVASTATIN 40 MG PO TABS
40.0000 mg | ORAL_TABLET | Freq: Every day | ORAL | 1 refills | Status: DC
  Filled 2022-10-07: qty 90, 90d supply, fill #0
  Filled 2023-01-07: qty 90, 90d supply, fill #1

## 2022-10-20 ENCOUNTER — Ambulatory Visit: Payer: Commercial Managed Care - PPO | Admitting: Dermatology

## 2022-11-22 ENCOUNTER — Encounter (HOSPITAL_COMMUNITY): Payer: Self-pay

## 2022-11-22 NOTE — Telephone Encounter (Signed)
Prolia VOB initiated via [email or web address]  Next Prolia inj DUE: 12/28/22

## 2022-11-24 ENCOUNTER — Other Ambulatory Visit (HOSPITAL_COMMUNITY): Payer: Self-pay

## 2022-11-29 MED ORDER — DENOSUMAB 60 MG/ML ~~LOC~~ SOSY
60.0000 mg | PREFILLED_SYRINGE | SUBCUTANEOUS | Status: DC
Start: 2022-12-28 — End: 2023-07-13

## 2022-11-29 NOTE — Addendum Note (Signed)
Addended by: Dierdre Searles on: 11/29/2022 11:54 AM   Modules accepted: Orders

## 2022-11-29 NOTE — Telephone Encounter (Signed)
Prior Auth REQUIRED for Prolia  Prior Auth: APPROVED PA# [id number] Valid: 05/17/22-05/17/23

## 2022-11-29 NOTE — Telephone Encounter (Signed)
Pt ready for scheduling on or after 12/28/22  Out-of-pocket cost due at time of visit: $60  Primary: [employer] Prolia co-insurance: $60 Admin fee co-insurance: 0%  Deductible: does not apply  Prior Auth: APPROVED PA# [id number] Valid: 05/17/22-05/17/23  Secondary: N/A Prolia co-insurance:  Admin fee co-insurance:  Deductible:  Prior Auth:  PA# Valid:     ** This summary of benefits is an estimation of the patient's out-of-pocket cost. Exact cost may very based on individual plan coverage.

## 2022-12-03 ENCOUNTER — Other Ambulatory Visit: Payer: Self-pay | Admitting: Pharmacy Technician

## 2022-12-03 ENCOUNTER — Other Ambulatory Visit (HOSPITAL_COMMUNITY): Payer: Self-pay

## 2022-12-03 NOTE — Progress Notes (Signed)
Specialty Pharmacy Refill Coordination Note  Angela Lam is a 62 y.o. female contacted today regarding refills of specialty medication(s) Denosumab   Patient requested Courier to Provider Office   Delivery date: 12/23/22   Verified address: [facility] [address]   Medication will be filled on 12/22/22.

## 2022-12-15 ENCOUNTER — Other Ambulatory Visit (HOSPITAL_COMMUNITY): Payer: Self-pay

## 2022-12-15 ENCOUNTER — Other Ambulatory Visit: Payer: Self-pay

## 2022-12-15 ENCOUNTER — Other Ambulatory Visit: Payer: Self-pay | Admitting: Internal Medicine

## 2022-12-15 DIAGNOSIS — F411 Generalized anxiety disorder: Secondary | ICD-10-CM

## 2022-12-15 MED ORDER — TRAZODONE HCL 50 MG PO TABS
50.0000 mg | ORAL_TABLET | Freq: Every day | ORAL | 0 refills | Status: DC
  Filled 2022-12-15: qty 90, 90d supply, fill #0

## 2022-12-22 ENCOUNTER — Other Ambulatory Visit (HOSPITAL_COMMUNITY): Payer: Self-pay

## 2022-12-22 ENCOUNTER — Other Ambulatory Visit: Payer: Self-pay | Admitting: Endocrinology

## 2022-12-22 ENCOUNTER — Other Ambulatory Visit: Payer: Self-pay

## 2022-12-23 ENCOUNTER — Other Ambulatory Visit (HOSPITAL_COMMUNITY): Payer: Self-pay

## 2022-12-23 NOTE — Progress Notes (Signed)
Spoke with [facility] at office. They will be providing medication for patient. Dis-enrolling

## 2022-12-30 ENCOUNTER — Ambulatory Visit (INDEPENDENT_AMBULATORY_CARE_PROVIDER_SITE_OTHER): Payer: Commercial Managed Care - PPO

## 2022-12-30 DIAGNOSIS — M810 Age-related osteoporosis without current pathological fracture: Secondary | ICD-10-CM | POA: Diagnosis not present

## 2022-12-30 MED ORDER — DENOSUMAB 60 MG/ML ~~LOC~~ SOSY
60.0000 mg | PREFILLED_SYRINGE | Freq: Once | SUBCUTANEOUS | Status: DC
Start: 2023-06-30 — End: 2023-07-13

## 2022-12-30 MED ORDER — DENOSUMAB 60 MG/ML ~~LOC~~ SOSY
60.0000 mg | PREFILLED_SYRINGE | Freq: Once | SUBCUTANEOUS | Status: AC
Start: 2022-12-30 — End: 2022-12-30
  Administered 2022-12-30: 60 mg via SUBCUTANEOUS

## 2022-12-30 NOTE — Progress Notes (Signed)
After obtaining consent, and per orders of Dr. Lonzo Cloud, injection of Prolia given by Pollie Meyer. Patient instructed to remain in clinic for 20 minutes afterwards, and to report any adverse reaction to me immediately.

## 2022-12-30 NOTE — Addendum Note (Signed)
Addended by: Pollie Meyer on: 12/30/2022 01:49 PM   Modules accepted: Orders

## 2023-01-01 NOTE — Telephone Encounter (Signed)
Last Prolia inj 12/30/22 Next Prolia inj due 07/01/23

## 2023-01-05 ENCOUNTER — Ambulatory Visit: Payer: Commercial Managed Care - PPO | Admitting: Internal Medicine

## 2023-01-05 ENCOUNTER — Encounter: Payer: Self-pay | Admitting: Internal Medicine

## 2023-01-05 VITALS — BP 114/82 | HR 85 | Temp 98.1°F | Ht 60.0 in | Wt 138.0 lb

## 2023-01-05 DIAGNOSIS — M810 Age-related osteoporosis without current pathological fracture: Secondary | ICD-10-CM

## 2023-01-05 DIAGNOSIS — E785 Hyperlipidemia, unspecified: Secondary | ICD-10-CM | POA: Diagnosis not present

## 2023-01-05 LAB — HEPATIC FUNCTION PANEL
ALT: 24 U/L (ref 0–35)
AST: 20 U/L (ref 0–37)
Albumin: 4.1 g/dL (ref 3.5–5.2)
Alkaline Phosphatase: 21 U/L — ABNORMAL LOW (ref 39–117)
Bilirubin, Direct: 0.1 mg/dL (ref 0.0–0.3)
Total Bilirubin: 0.6 mg/dL (ref 0.2–1.2)
Total Protein: 7 g/dL (ref 6.0–8.3)

## 2023-01-05 LAB — LIPID PANEL
Cholesterol: 211 mg/dL — ABNORMAL HIGH (ref 0–200)
HDL: 90.4 mg/dL (ref >39.00)
LDL Cholesterol: 107 mg/dL — ABNORMAL HIGH (ref 0–99)
NonHDL: 121.07
Total CHOL/HDL Ratio: 2
Triglycerides: 70 mg/dL (ref 0.0–149.0)
VLDL: 14 mg/dL (ref 0.0–40.0)

## 2023-01-05 LAB — CBC WITH DIFFERENTIAL/PLATELET
Basophils Absolute: 0.1 10*3/uL (ref 0.0–0.1)
Basophils Relative: 0.8 % (ref 0.0–3.0)
Eosinophils Absolute: 0.1 10*3/uL (ref 0.0–0.7)
Eosinophils Relative: 1.8 % (ref 0.0–5.0)
HCT: 45.3 % (ref 36.0–46.0)
Hemoglobin: 14.7 g/dL (ref 12.0–15.0)
Lymphocytes Relative: 23.9 % (ref 12.0–46.0)
Lymphs Abs: 1.6 10*3/uL (ref 0.7–4.0)
MCHC: 32.5 g/dL (ref 30.0–36.0)
MCV: 102.1 fL — ABNORMAL HIGH (ref 78.0–100.0)
Monocytes Absolute: 0.3 10*3/uL (ref 0.1–1.0)
Monocytes Relative: 5.2 % (ref 3.0–12.0)
Neutro Abs: 4.6 10*3/uL (ref 1.4–7.7)
Neutrophils Relative %: 68.3 % (ref 43.0–77.0)
Platelets: 279 10*3/uL (ref 150.0–400.0)
RBC: 4.44 Mil/uL (ref 3.87–5.11)
RDW: 13.3 % (ref 11.5–15.5)
WBC: 6.7 10*3/uL (ref 4.0–10.5)

## 2023-01-05 LAB — VITAMIN D 25 HYDROXY (VIT D DEFICIENCY, FRACTURES): VITD: 54.04 ng/mL (ref 30.00–100.00)

## 2023-01-05 LAB — BASIC METABOLIC PANEL
BUN: 15 mg/dL (ref 6–23)
CO2: 29 meq/L (ref 19–32)
Calcium: 9.8 mg/dL (ref 8.4–10.5)
Chloride: 103 meq/L (ref 96–112)
Creatinine, Ser: 0.86 mg/dL (ref 0.40–1.20)
GFR: 72.86 mL/min (ref >60.00)
Glucose, Bld: 93 mg/dL (ref 70–99)
Potassium: 4 meq/L (ref 3.5–5.1)
Sodium: 141 meq/L (ref 135–145)

## 2023-01-05 LAB — TSH: TSH: 1.28 u[IU]/mL (ref 0.35–5.50)

## 2023-01-05 LAB — PHOSPHORUS: Phosphorus: 3.7 mg/dL (ref 2.3–4.6)

## 2023-01-05 NOTE — Progress Notes (Signed)
Subjective:  Patient ID: Angela Lam, female    DOB: August 28, 1960  Age: 62 y.o. MRN: [id number]  CC: Hyperlipidemia   HPI Angela Lam presents for f/up -  She exercises on a treadmill every day. Her endurance is good.  Discussed the use of AI scribe software for clinical note transcription with the patient, who gave verbal consent to proceed.  History of Present Illness   The patient, with a history of hyperlipidemia and osteoporosis, reports feeling well with no complaints. They deny experiencing chest pain, shortness of breath, dizziness, or lightheadedness. They also deny any muscle or joint aches from their statin medication. They also received a flu shot recently. They had a severe COVID-19 infection in August and plan to get the vaccine after the recommended three-month waiting period. They report that their mood is stable, they are sleeping well, and continue to work.       Outpatient Medications Prior to Visit  Medication Sig Dispense Refill   aspirin EC 81 MG tablet Take 81 mg by mouth every other day.     BIOTIN PO Take by mouth.     Calcium Carbonate-Vitamin D 600-400 MG-UNIT tablet      cholecalciferol (VITAMIN D3) 25 MCG (1000 UNIT) tablet Take 2,000 Units by mouth daily.     citalopram (CELEXA) 20 MG tablet Take 1 tablet (20 mg total) by mouth in the morning. 90 tablet 1   denosumab (PROLIA) 60 MG/ML SOSY injection INJECT 60MG  INTO THE SKIN EVERY 6 MONTHS. 1 mL 1   simvastatin (ZOCOR) 40 MG tablet Take 1 tablet (40 mg total) by mouth at bedtime. 90 tablet 1   traZODone (DESYREL) 50 MG tablet Take 1 tablet (50 mg total) by mouth at bedtime. 90 tablet 0   Facility-Administered Medications Prior to Visit  Medication Dose Route Frequency Provider Last Rate Last Admin   denosumab (PROLIA) injection 60 mg  60 mg Subcutaneous Q6 months Shamleffer, Konrad Dolores, MD       [START ON 06/30/2023] denosumab (PROLIA) injection 60 mg  60 mg Subcutaneous Once Shamleffer,  Konrad Dolores, MD        ROS Review of Systems  Constitutional:  Negative for appetite change, chills, diaphoresis and fatigue.  HENT: Negative.    Eyes: Negative.   Respiratory: Negative.  Negative for cough, chest tightness, shortness of breath and wheezing.   Cardiovascular:  Negative for chest pain, palpitations and leg swelling.  Gastrointestinal:  Negative for abdominal pain, constipation, diarrhea, nausea and rectal pain.  Endocrine: Negative.   Genitourinary:  Negative for difficulty urinating.  Musculoskeletal: Negative.  Negative for arthralgias and myalgias.  Skin: Negative.   Neurological:  Negative for dizziness, weakness and light-headedness.  Hematological:  Negative for adenopathy. Does not bruise/bleed easily.  Psychiatric/Behavioral:  Positive for sleep disturbance. Negative for decreased concentration and dysphoric mood. The patient is nervous/anxious.     Objective:  BP 114/82 (BP Location: Left Arm, Patient Position: Sitting, Cuff Size: Normal)   Pulse 85   Temp 98.1 F (36.7 C) (Oral)   Ht 5' (1.524 m)   Wt 138 lb (62.6 kg)   SpO2 97%   BMI 26.95 kg/m   BP Readings from Last 3 Encounters:  01/05/23 114/82  09/16/22 116/74  07/02/22 122/72    Wt Readings from Last 3 Encounters:  01/05/23 138 lb (62.6 kg)  09/16/22 135 lb (61.2 kg)  07/02/22 137 lb (62.1 kg)    Physical Exam Vitals reviewed.  Constitutional:  Appearance: Normal appearance.  HENT:     Nose: Nose normal.     Mouth/Throat:     Mouth: Mucous membranes are moist.  Eyes:     General: No scleral icterus.    Conjunctiva/sclera: Conjunctivae normal.  Cardiovascular:     Rate and Rhythm: Normal rate and regular rhythm.     Heart sounds: No murmur heard. Pulmonary:     Effort: Pulmonary effort is normal.     Breath sounds: No stridor. No wheezing, rhonchi or rales.  Abdominal:     General: Abdomen is flat.     Palpations: There is no mass.     Tenderness: There is no  abdominal tenderness. There is no guarding.     Hernia: No hernia is present.  Musculoskeletal:        General: Normal range of motion.     Cervical back: Neck supple.     Right lower leg: No edema.     Left lower leg: No edema.  Lymphadenopathy:     Cervical: No cervical adenopathy.  Skin:    General: Skin is warm and dry.  Neurological:     General: No focal deficit present.     Mental Status: She is alert. Mental status is at baseline.  Psychiatric:        Mood and Affect: Mood normal.        Behavior: Behavior normal.     Lab Results  Component Value Date   WBC 6.7 01/05/2023   HGB 14.7 01/05/2023   HCT 45.3 01/05/2023   PLT 279.0 01/05/2023   GLUCOSE 93 01/05/2023   CHOL 211 (H) 01/05/2023   TRIG 70.0 01/05/2023   HDL 90.40 01/05/2023   LDLCALC 107 (H) 01/05/2023   ALT 24 01/05/2023   AST 20 01/05/2023   NA 141 01/05/2023   K 4.0 01/05/2023   CL 103 01/05/2023   CREATININE 0.86 01/05/2023   BUN 15 01/05/2023   CO2 29 01/05/2023   TSH 1.28 01/05/2023    No results found.  Assessment & Plan:   Osteoporosis without current pathological fracture, unspecified osteoporosis type -     Phosphorus; Future -     VITAMIN D 25 Hydroxy (Vit-D Deficiency, Fractures); Future -     Hepatic function panel; Future -     CBC with Differential/Platelet; Future -     Basic metabolic panel; Future  Hyperlipidemia with target LDL less than 130 - LDL goal achieved. Doing well on the statin  -     Lipid panel; Future -     TSH; Future -     Hepatic function panel; Future -     CBC with Differential/Platelet; Future -     Basic metabolic panel; Future     Follow-up: Return in about 6 months (around 07/05/2023).  Sanda Linger, MD

## 2023-01-05 NOTE — Patient Instructions (Signed)

## 2023-01-14 ENCOUNTER — Encounter: Payer: Self-pay | Admitting: Internal Medicine

## 2023-02-17 ENCOUNTER — Other Ambulatory Visit: Payer: Self-pay | Admitting: Internal Medicine

## 2023-02-17 ENCOUNTER — Other Ambulatory Visit (HOSPITAL_COMMUNITY): Payer: Self-pay

## 2023-02-17 MED ORDER — CITALOPRAM HYDROBROMIDE 20 MG PO TABS
20.0000 mg | ORAL_TABLET | Freq: Every morning | ORAL | 1 refills | Status: DC
  Filled 2023-02-17: qty 90, 90d supply, fill #0
  Filled 2023-05-21: qty 90, 90d supply, fill #1

## 2023-03-14 ENCOUNTER — Other Ambulatory Visit: Payer: Self-pay | Admitting: Internal Medicine

## 2023-03-14 DIAGNOSIS — F411 Generalized anxiety disorder: Secondary | ICD-10-CM

## 2023-03-14 MED ORDER — TRAZODONE HCL 50 MG PO TABS
50.0000 mg | ORAL_TABLET | Freq: Every day | ORAL | 0 refills | Status: DC
  Filled 2023-03-14: qty 90, 90d supply, fill #0

## 2023-03-16 ENCOUNTER — Other Ambulatory Visit (HOSPITAL_COMMUNITY): Payer: Self-pay

## 2023-04-03 IMAGING — MG MM DIGITAL SCREENING BILAT W/ TOMO AND CAD
8 series · 8 of 24 positions shown · non-contrast
Comparison: Previous exam(s).

CLINICAL DATA: Screening.

EXAM:
DIGITAL SCREENING BILATERAL MAMMOGRAM WITH TOMOSYNTHESIS AND CAD
TECHNIQUE: Bilateral screening digital craniocaudal and mediolateral oblique
mammograms were obtained. Bilateral screening digital breast
tomosynthesis was performed. The images were evaluated with
computer-aided detection.

[R MLO synth-2D]
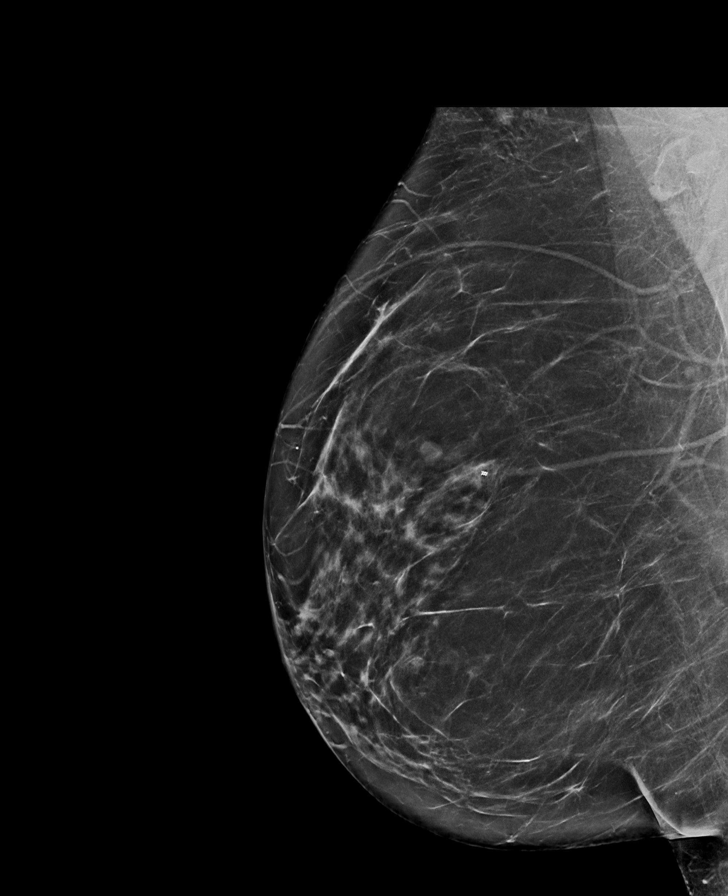

[L CC synth-2D]
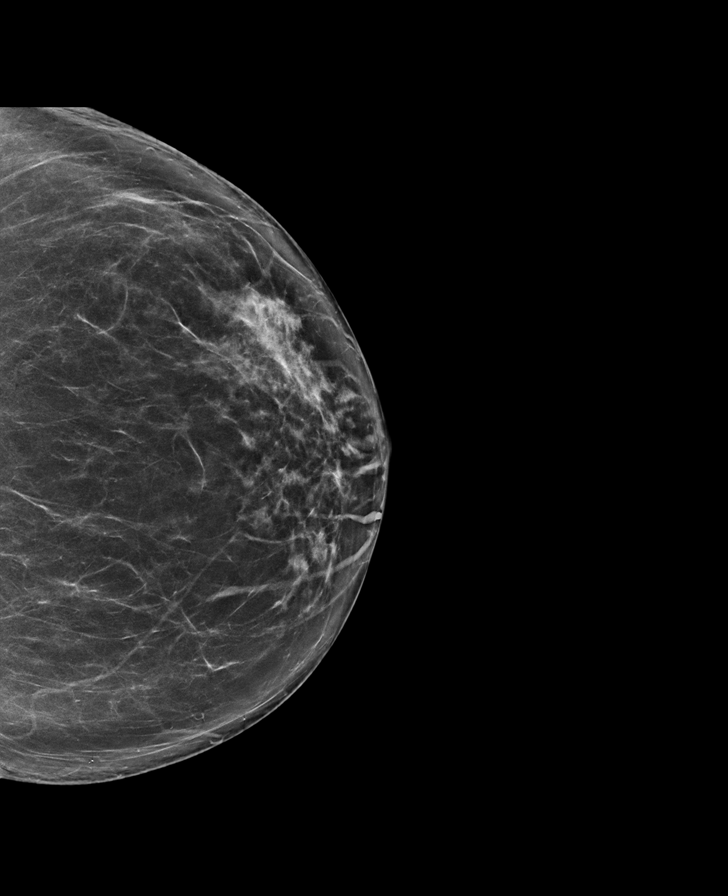

[R CC synth-2D]
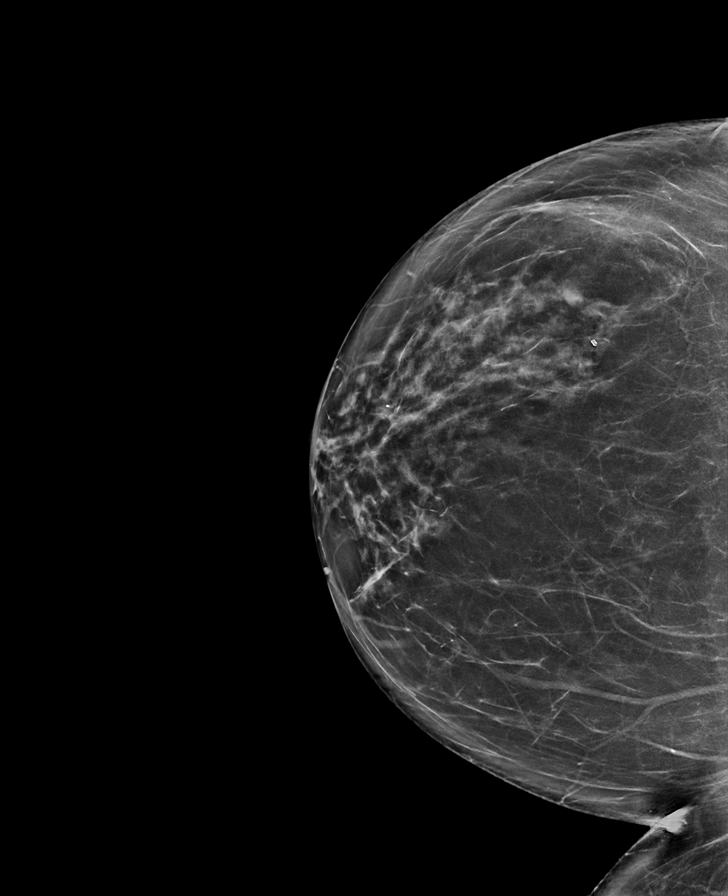

[L MLO synth-2D]
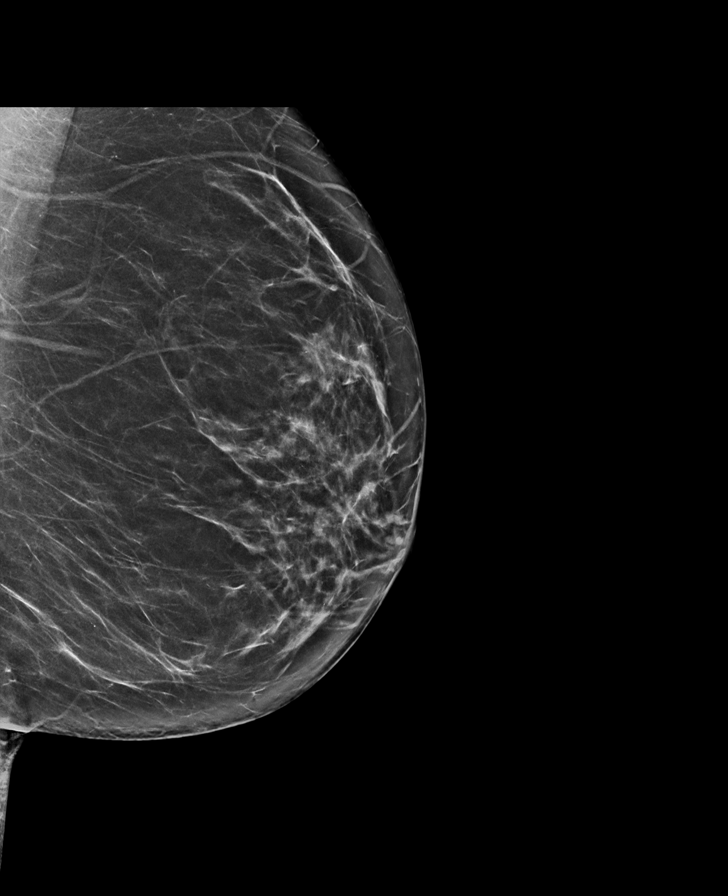

[R CC tomo · tomo slice 40/79.0]
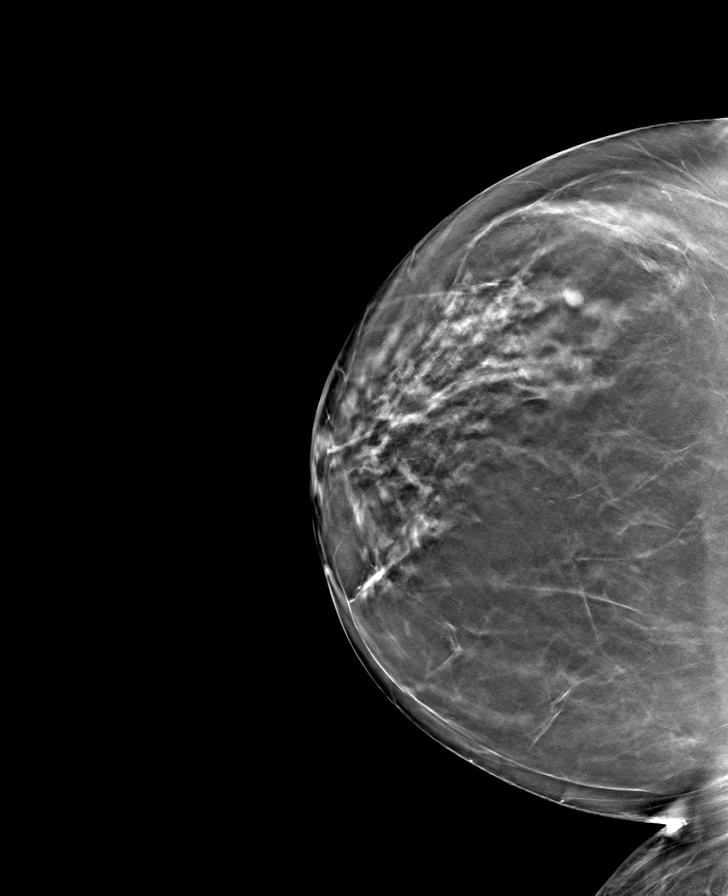

[L CC tomo · tomo slice 41/80.0]
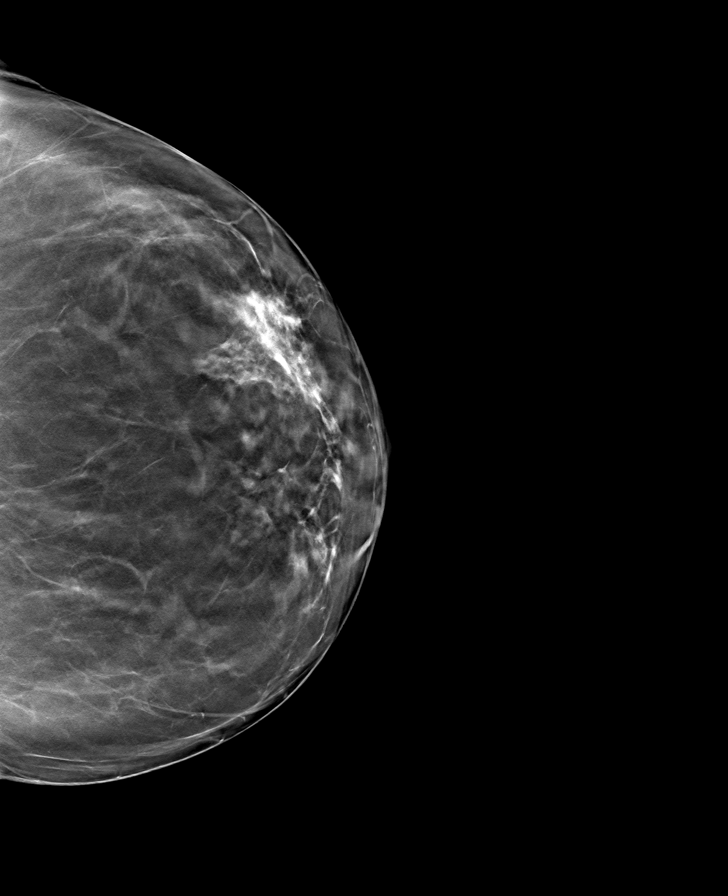

[R MLO tomo · tomo slice 41/81.0]
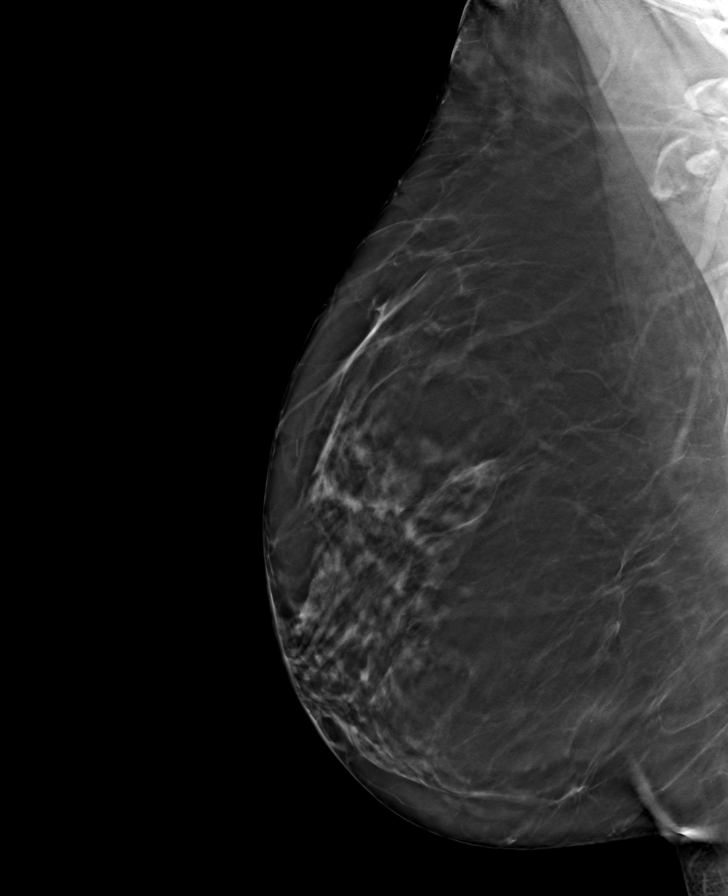

[L MLO tomo · tomo slice 39/78.0]
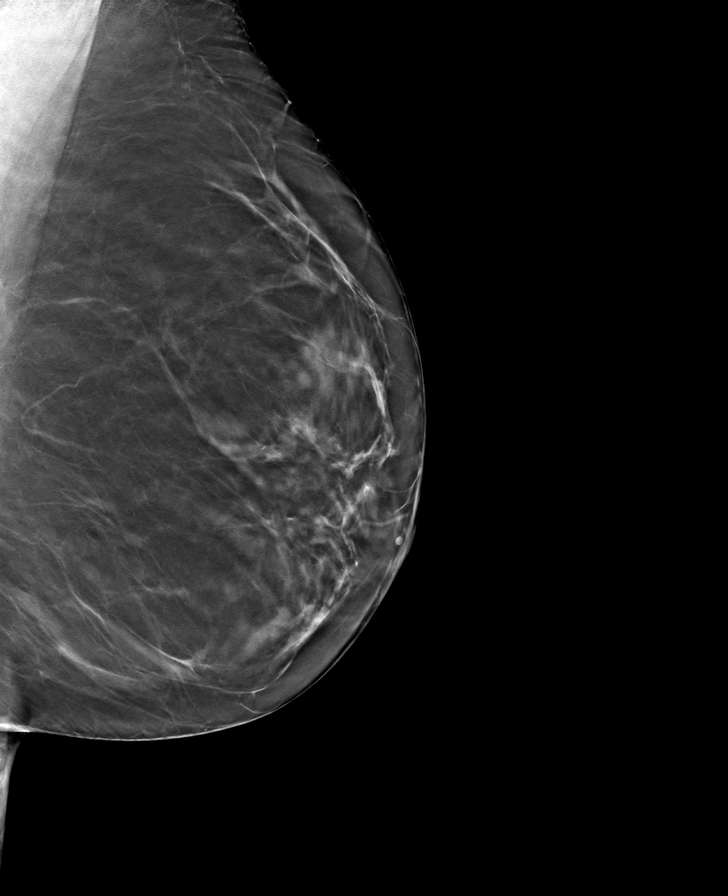

[8 of 24 positions shown; findings below may reference images not displayed]

ACR Breast Density Category b: There are scattered areas of
fibroglandular density.
FINDINGS: There are no findings suspicious for malignancy.
IMPRESSION: No mammographic evidence of malignancy. A result letter of this
screening mammogram will be mailed directly to the patient.

RECOMMENDATION:
Screening mammogram in one year. (Code:51-O-LD2)

BI-RADS CATEGORY  1: Negative.

## 2023-04-05 ENCOUNTER — Other Ambulatory Visit: Payer: Self-pay | Admitting: Internal Medicine

## 2023-04-06 ENCOUNTER — Other Ambulatory Visit (HOSPITAL_COMMUNITY): Payer: Self-pay

## 2023-04-06 MED ORDER — SIMVASTATIN 40 MG PO TABS
40.0000 mg | ORAL_TABLET | Freq: Every day | ORAL | 1 refills | Status: DC
  Filled 2023-04-06: qty 90, 90d supply, fill #0
  Filled 2023-07-02: qty 90, 90d supply, fill #1

## 2023-05-22 NOTE — Telephone Encounter (Signed)
 Prolia VOB initiated via [email or web address]  Next Prolia inj DUE: 07/01/23

## 2023-06-01 NOTE — Telephone Encounter (Signed)
 Medical Buy and Annette Stable - Prior Authorization REQUIRED   PA PROCESS DETAILS: PA is required. Call [phone] or complete the PA form available at  [email or web address]   Fax completed form to [phone]. Or submit PA  request online at [email or web address]

## 2023-06-12 ENCOUNTER — Other Ambulatory Visit: Payer: Self-pay | Admitting: Internal Medicine

## 2023-06-12 ENCOUNTER — Other Ambulatory Visit (HOSPITAL_COMMUNITY): Payer: Self-pay

## 2023-06-12 DIAGNOSIS — F411 Generalized anxiety disorder: Secondary | ICD-10-CM

## 2023-06-12 MED ORDER — TRAZODONE HCL 50 MG PO TABS
50.0000 mg | ORAL_TABLET | Freq: Every day | ORAL | 0 refills | Status: DC
  Filled 2023-06-12: qty 90, 90d supply, fill #0

## 2023-06-13 NOTE — Telephone Encounter (Addendum)
 Availity/Novologix down, unable to obtain PA today.   Also, will need pharmacy PA instead of medical PA if pt decides to participate in the [facility] employee program.

## 2023-06-15 ENCOUNTER — Other Ambulatory Visit: Payer: Self-pay

## 2023-06-15 ENCOUNTER — Encounter (HOSPITAL_COMMUNITY): Payer: Self-pay

## 2023-06-15 ENCOUNTER — Telehealth: Payer: Self-pay

## 2023-06-15 ENCOUNTER — Telehealth: Payer: Self-pay | Admitting: Pharmacist

## 2023-06-15 MED ORDER — PROLIA 60 MG/ML ~~LOC~~ SOSY
PREFILLED_SYRINGE | SUBCUTANEOUS | 1 refills | Status: DC
  Filled 2023-06-15: qty 1, fill #0

## 2023-06-15 NOTE — Progress Notes (Signed)
 Pharmacy Patient Advocate Encounter   Received notification from Patient Pharmacy that prior authorization for Prolia is required/requested.   Insurance verification completed.   The patient is insured through [facility] .    [facility] patient. Sent to [employer]

## 2023-06-15 NOTE — Telephone Encounter (Signed)
 Requested Prescriptions   Signed Prescriptions Disp Refills   denosumab (PROLIA) 60 MG/ML SOSY injection 1 mL 1    Sig: INJECT 60MG  INTO THE SKIN EVERY 6 MONTHS.    Authorizing Provider: Deno Flair    Ordering User: Vernon Goodpasture

## 2023-06-15 NOTE — Telephone Encounter (Signed)
 Called patient to schedule an appointment for the [facility] Employee [employer] Specialty Medication [facility]. I was unable to reach the patient so I left a HIPAA-compliant message requesting that the patient return my call.   Butch Penny, PharmD, Patsy [place], CPP Clinical Pharmacist [facility] [phone]

## 2023-06-16 ENCOUNTER — Other Ambulatory Visit: Payer: Self-pay

## 2023-06-16 ENCOUNTER — Other Ambulatory Visit (HOSPITAL_COMMUNITY): Payer: Self-pay

## 2023-06-18 ENCOUNTER — Other Ambulatory Visit (HOSPITAL_COMMUNITY): Payer: Self-pay

## 2023-06-22 ENCOUNTER — Other Ambulatory Visit: Payer: Self-pay

## 2023-06-22 ENCOUNTER — Other Ambulatory Visit (HOSPITAL_COMMUNITY): Payer: Self-pay

## 2023-06-24 ENCOUNTER — Other Ambulatory Visit (HOSPITAL_COMMUNITY): Payer: Self-pay

## 2023-06-24 ENCOUNTER — Other Ambulatory Visit: Payer: Self-pay

## 2023-06-25 NOTE — Telephone Encounter (Signed)
 Pharmacy Benefit   Prior Authorization initiated for PROLIA  via [email or web address] KEY: [id number]      Medical Buy and Bill  Prior Authorization initiated for PROLIA  via Availity/Novologix Case ID: [id number]  Pending review

## 2023-06-26 ENCOUNTER — Telehealth: Payer: Self-pay | Admitting: Pharmacist

## 2023-06-26 NOTE — Telephone Encounter (Signed)
 Called patient to schedule an appointment for the [facility] Employee [employer] Specialty Medication [facility]. I was unable to reach the patient so I left a HIPAA-compliant message requesting that the patient return my call.   Butch Penny, PharmD, Patsy [place], CPP Clinical Pharmacist [facility] [phone]

## 2023-06-29 ENCOUNTER — Other Ambulatory Visit: Payer: Self-pay

## 2023-06-29 ENCOUNTER — Encounter: Payer: Self-pay | Admitting: Internal Medicine

## 2023-06-29 ENCOUNTER — Telehealth: Payer: Self-pay | Admitting: Internal Medicine

## 2023-06-29 ENCOUNTER — Telehealth: Payer: Self-pay

## 2023-06-29 ENCOUNTER — Ambulatory Visit: Payer: Commercial Managed Care - PPO | Admitting: Internal Medicine

## 2023-06-29 ENCOUNTER — Other Ambulatory Visit (HOSPITAL_COMMUNITY): Payer: Self-pay

## 2023-06-29 VITALS — BP 110/74 | HR 82 | Ht 60.0 in | Wt 136.0 lb

## 2023-06-29 DIAGNOSIS — M810 Age-related osteoporosis without current pathological fracture: Secondary | ICD-10-CM | POA: Diagnosis not present

## 2023-06-29 NOTE — Telephone Encounter (Addendum)
 Pharmacy Benefit  Prior Authorization for PROLIA  APPROVED PA# [id number] Valid: 06/29/23-06/27/24       Medical Buy and Raenette Bumps   Prior Authorization for PROLIA  APPROVED PA# [id number] Valid: 07/01/23-06/29/24

## 2023-06-29 NOTE — Telephone Encounter (Signed)
 Good Morning, We are needing the approval  for patient to get Prolia  shipped from [facility].

## 2023-06-29 NOTE — Telephone Encounter (Signed)
 Good Morning Angela Lam,  Pharmacy prior auth initiated last week via CoverMyMeds. I Will update encounter.   Thank you!

## 2023-06-29 NOTE — Progress Notes (Signed)
 Name: Angela Lam  MRN/ DOB: [id number], [birth year]    Age/ Sex: 63 y.o., female     PCP: Arcadio Knuckles, MD   Reason for Endocrinology Evaluation: Osteoporosis     Initial Endocrinology Clinic Visit: 02/06/2020    PATIENT IDENTIFIER: Angela Lam is a 63 y.o., female with a past medical history of osteoporosis. She has followed with [facility] since 02/06/2020 for consultative assistance with management of her osteoporosis.   HISTORICAL SUMMARY:   Dx'ed: 2016 Secondary cause: none Fractures: left little toe (2018--traumatic) Past rx: Actonel 2019-2020; Tymlos 2020-2021.  Rx: Started Fosamax  2020, started Prolia  2022.    She was followed by Dr. Washington Hacker from 2021 until 06/2021, she was on Prolia  and alendronate  at the time  DXA scan in 2019 which was done at a different facility showed a T-score of -2.9 at the AP spine.  The scans were not legible for the hips and the forearm, as that digits were not clear if it was a -2 or and -3.  DXA scan in 2021 showed improvement in BMD DXA scan in 2022 was done at a different location, hence unable to compare prior with osteopenia   She was advised to discontinue alendronate  and just continue with Prolia  by 06/2022  SUBJECTIVE:    Today (06/29/2023):  Angela Lam is here for a follow-up on osteoporosis.   Her last Prolia  injection was 12/30/2022 Denies recent fall  No fractures  Denies heartburn or nausea Denies  constipation or diarrhea  She continues with hair loss that she attributes to prolia  , she is on Biotin, stable hair loss    Calcium 600 mg BID  Vitamin D  1000 iu  daily   HISTORY:  Past Medical History:  Past Medical History:  Diagnosis Date   Anxiety    Blood in stool    Chicken pox    Depression    Hyperlipidemia    Osteoporosis    Past Surgical History:  Past Surgical History:  Procedure Laterality Date   BREAST BIOPSY Right 2013   benign   COLOSTOMY     >27yrs ago    Social History:  reports that she has never smoked. She has never been exposed to tobacco smoke. She has never used smokeless tobacco. She reports current alcohol use. She reports that she does not use drugs. Family History:  Family History  Problem Relation Age of Onset   Asthma Mother    Cancer Mother    COPD Mother    Diabetes Mother    Early death Mother    Heart disease Mother    Hyperlipidemia Mother    Kidney disease Mother    Miscarriages / [place] Mother    Stroke Mother    Heart attack Mother    Cancer Father    Heart disease Father    Arthritis Sister    Asthma Sister    COPD Sister    Depression Sister    Diabetes Sister    Heart disease Sister    Hyperlipidemia Sister    Hypertension Sister    Heart attack Sister    Osteoporosis Maternal Grandmother    Breast cancer Neg Hx    Colon cancer Neg Hx    Colon polyps Neg Hx    Esophageal cancer Neg Hx    Rectal cancer Neg Hx    Stomach cancer Neg Hx      HOME MEDICATIONS: Allergies as of 06/29/2023   No Known Allergies  Medication List        Accurate as of June 29, 2023  7:49 AM. If you have any questions, ask your nurse or doctor.          aspirin EC 81 MG tablet Take 81 mg by mouth every other day.   BIOTIN PO Take by mouth.   Calcium Carbonate-Vitamin D  600-400 MG-UNIT tablet   cholecalciferol 25 MCG (1000 UNIT) tablet Commonly known as: VITAMIN D3 Take 2,000 Units by mouth daily.   citalopram  20 MG tablet Commonly known as: CELEXA  Take 1 tablet (20 mg total) by mouth in the morning.   Prolia  60 MG/ML Sosy injection Generic drug: denosumab  INJECT 60MG  INTO THE SKIN EVERY 6 MONTHS.   simvastatin  40 MG tablet Commonly known as: ZOCOR  Take 1 tablet (40 mg total) by mouth at bedtime.   traZODone  50 MG tablet Commonly known as: DESYREL  Take 1 tablet (50 mg total) by mouth at bedtime.          OBJECTIVE:   PHYSICAL EXAM: VS: BP 110/74 (BP Location: Left Arm,  Patient Position: Sitting, Cuff Size: Normal)   Pulse 82   Ht 5' (1.524 m)   Wt 136 lb (61.7 kg)   SpO2 98%   BMI 26.56 kg/m    EXAM: General: Pt appears well and is in NAD  Neck: General: Supple without adenopathy. Thyroid : Thyroid  size normal.  No goiter or nodules appreciated.   Lungs: Clear with good BS bilat   Heart: Auscultation: RRR.  Abdomen: soft, nontender  Extremities:  BL LE: No pretibial edema   Mental Status: Judgment, insight: Intact Orientation: Oriented to time, place, and person Mood and affect: No depression, anxiety, or agitation     DATA REVIEWED:     Latest Reference Range & Units 01/05/23 08:35  Sodium 135 - 145 mEq/L 141  Potassium 3.5 - 5.1 mEq/L 4.0  Chloride 96 - 112 mEq/L 103  CO2 19 - 32 mEq/L 29  Glucose 70 - 99 mg/dL 93  BUN 6 - 23 mg/dL 15  Creatinine 1.61 - 0.96 mg/dL 0.45  Calcium 8.4 - 40.9 mg/dL 9.8  Phosphorus 2.3 - 4.6 mg/dL 3.7  Alkaline Phosphatase 39 - 117 U/L 21 (L)  Albumin 3.5 - 5.2 g/dL 4.1  AST 0 - 37 U/L 20  ALT 0 - 35 U/L 24  Total Protein 6.0 - 8.3 g/dL 7.0  Bilirubin, Direct 0.0 - 0.3 mg/dL 0.1  Total Bilirubin 0.2 - 1.2 mg/dL 0.6  GFR >81.19 mL/min 72.86  (L): Data is abnormally low   DXA 01/17/2021     Lumbar spine L1-L4 Femoral neck (FN)  T-score -0.5 RFN: -2.0 LFN: -1.8  Change in BMD from previous DXA test (%) n/a n/a        Old records , labs and images have been reviewed.   ASSESSMENT / PLAN / RECOMMENDATIONS:   Osteoporosis:  - Last DXA scan did not show any evidence of osteoporosis  -Discussed the importance of optimizing calcium , vitamin D  intake, and weightbearing exercises  -She has been on Tymlos and Actonel in the past, she noted dramatic improvement with Tymlos -She was on alendronate  in conjunction with Prolia  , alendronate  was discontinued by 06/2022 -She continues to endorse hair loss with Prolia , if her bone density looks good, we will consider Reclast infusion - An order for  repeat DXA scan has been placed  Medications  Continue calcium 600 mg twice daily Continue vitamin D  1000 IU daily Prolia  60 mg  every 6 months   Follow-up in 6 months     Signed electronically by: Natale Bail, MD  [facility]  [facility] [address] [place], [place] [address] Phone: [phone] FAX: [phone]      CC: Arcadio Knuckles, MD [address] [place] [address] Phone: [phone]  Fax: [phone]   Return to Endocrinology clinic as below: Future Appointments  Date Time Provider Department Center  07/13/2023  8:20 AM Arcadio Knuckles, MD [facility] None

## 2023-06-29 NOTE — Telephone Encounter (Signed)
 Please schedule the pt for Prolia  on [facility] when they open for osteoporosis    Thanks

## 2023-07-07 ENCOUNTER — Inpatient Hospital Stay: Admission: RE | Admit: 2023-07-07 | Source: Ambulatory Visit

## 2023-07-08 ENCOUNTER — Other Ambulatory Visit: Payer: Self-pay

## 2023-07-08 NOTE — Progress Notes (Signed)
 PA approved for 1 ml per 30 days.   Pharmacy Patient Advocate Encounter  Insurance verification completed.   The patient is insured through [facility]   Ran test claim for Prolia . Co-pay is $0. Patient has copay card.   This test claim was processed through [facility]- copay amounts may vary at other pharmacies due to pharmacy/plan contracts, or as the patient moves through the different stages of their insurance plan.

## 2023-07-10 NOTE — Telephone Encounter (Signed)
 Hey Cayarel,  Can we f/u with pt and/or pharmacy to make sure she got set up for Prolia  through the [facility] Employee Specialty Pharmacy program?  Thank you for your help!

## 2023-07-13 ENCOUNTER — Other Ambulatory Visit: Payer: Self-pay

## 2023-07-13 ENCOUNTER — Encounter: Payer: Self-pay | Admitting: Internal Medicine

## 2023-07-13 ENCOUNTER — Other Ambulatory Visit (HOSPITAL_COMMUNITY): Payer: Self-pay

## 2023-07-13 ENCOUNTER — Ambulatory Visit
Admission: RE | Admit: 2023-07-13 | Discharge: 2023-07-13 | Disposition: A | Discharge status: Home / Self Care | Source: Ambulatory Visit | Attending: Internal Medicine | Admitting: Internal Medicine

## 2023-07-13 ENCOUNTER — Ambulatory Visit: Admitting: Internal Medicine

## 2023-07-13 ENCOUNTER — Ambulatory Visit: Attending: Internal Medicine | Admitting: Pharmacist

## 2023-07-13 ENCOUNTER — Encounter: Payer: Self-pay | Admitting: Pharmacist

## 2023-07-13 VITALS — BP 138/78 | HR 85 | Temp 98.4°F | Resp 16 | Ht 60.0 in | Wt 133.8 lb

## 2023-07-13 DIAGNOSIS — F411 Generalized anxiety disorder: Secondary | ICD-10-CM | POA: Diagnosis not present

## 2023-07-13 DIAGNOSIS — M810 Age-related osteoporosis without current pathological fracture: Secondary | ICD-10-CM

## 2023-07-13 DIAGNOSIS — Z79899 Other long term (current) drug therapy: Secondary | ICD-10-CM

## 2023-07-13 DIAGNOSIS — R030 Elevated blood-pressure reading, without diagnosis of hypertension: Secondary | ICD-10-CM | POA: Insufficient documentation

## 2023-07-13 DIAGNOSIS — E785 Hyperlipidemia, unspecified: Secondary | ICD-10-CM

## 2023-07-13 DIAGNOSIS — R0609 Other forms of dyspnea: Secondary | ICD-10-CM | POA: Diagnosis not present

## 2023-07-13 LAB — BASIC METABOLIC PANEL WITH GFR
BUN: 13 mg/dL (ref 6–23)
CO2: 28 meq/L (ref 19–32)
Calcium: 9.1 mg/dL (ref 8.4–10.5)
Chloride: 104 meq/L (ref 96–112)
Creatinine, Ser: 0.83 mg/dL (ref 0.40–1.20)
GFR: 75.75 mL/min (ref >60.00)
Glucose, Bld: 98 mg/dL (ref 70–99)
Potassium: 3.8 meq/L (ref 3.5–5.1)
Sodium: 140 meq/L (ref 135–145)

## 2023-07-13 LAB — URINALYSIS, ROUTINE W REFLEX MICROSCOPIC
Bilirubin Urine: NEGATIVE
Hgb urine dipstick: NEGATIVE
Ketones, ur: NEGATIVE
Leukocytes,Ua: NEGATIVE
Nitrite: NEGATIVE
RBC / HPF: NONE SEEN (ref 0–?)
Specific Gravity, Urine: 1.01 (ref 1.000–1.030)
Total Protein, Urine: NEGATIVE
Urine Glucose: NEGATIVE
Urobilinogen, UA: 0.2 (ref 0.0–1.0)
WBC, UA: NONE SEEN (ref 0–?)
pH: 6 (ref 5.0–8.0)

## 2023-07-13 LAB — TROPONIN I (HIGH SENSITIVITY): High Sens Troponin I: 4 ng/L (ref 2–17)

## 2023-07-13 LAB — LIPID PANEL
Cholesterol: 183 mg/dL (ref 0–200)
HDL: 78.5 mg/dL (ref >39.00)
LDL Cholesterol: 93 mg/dL (ref 0–99)
NonHDL: 104.34
Total CHOL/HDL Ratio: 2
Triglycerides: 55 mg/dL (ref 0.0–149.0)
VLDL: 11 mg/dL (ref 0.0–40.0)

## 2023-07-13 LAB — CBC WITH DIFFERENTIAL/PLATELET
Basophils Absolute: 0.1 10*3/uL (ref 0.0–0.1)
Basophils Relative: 0.9 % (ref 0.0–3.0)
Eosinophils Absolute: 0.1 10*3/uL (ref 0.0–0.7)
Eosinophils Relative: 1.3 % (ref 0.0–5.0)
HCT: 45.1 % (ref 36.0–46.0)
Hemoglobin: 14.9 g/dL (ref 12.0–15.0)
Lymphocytes Relative: 25 % (ref 12.0–46.0)
Lymphs Abs: 1.6 10*3/uL (ref 0.7–4.0)
MCHC: 33.1 g/dL (ref 30.0–36.0)
MCV: 102.1 fl — ABNORMAL HIGH (ref 78.0–100.0)
Monocytes Absolute: 0.3 10*3/uL (ref 0.1–1.0)
Monocytes Relative: 5.4 % (ref 3.0–12.0)
Neutro Abs: 4.2 10*3/uL (ref 1.4–7.7)
Neutrophils Relative %: 67.4 % (ref 43.0–77.0)
Platelets: 274 10*3/uL (ref 150.0–400.0)
RBC: 4.42 Mil/uL (ref 3.87–5.11)
RDW: 13.1 % (ref 11.5–15.5)
WBC: 6.3 10*3/uL (ref 4.0–10.5)

## 2023-07-13 LAB — HEPATIC FUNCTION PANEL
ALT: 22 U/L (ref 0–35)
AST: 22 U/L (ref 0–37)
Albumin: 4.1 g/dL (ref 3.5–5.2)
Alkaline Phosphatase: 18 U/L — ABNORMAL LOW (ref 39–117)
Bilirubin, Direct: 0.1 mg/dL (ref 0.0–0.3)
Total Bilirubin: 0.6 mg/dL (ref 0.2–1.2)
Total Protein: 6.8 g/dL (ref 6.0–8.3)

## 2023-07-13 LAB — BRAIN NATRIURETIC PEPTIDE: Pro B Natriuretic peptide (BNP): 26 pg/mL (ref 0.0–100.0)

## 2023-07-13 MED ORDER — ASPIRIN EC 81 MG PO TBEC
81.0000 mg | DELAYED_RELEASE_TABLET | ORAL | 1 refills | Status: AC
  Filled 2023-07-13: qty 45, 90d supply, fill #0

## 2023-07-13 MED ORDER — SIMVASTATIN 40 MG PO TABS
40.0000 mg | ORAL_TABLET | Freq: Every day | ORAL | 1 refills | Status: DC
  Filled 2023-07-13: qty 90, 90d supply, fill #0

## 2023-07-13 MED ORDER — TRAZODONE HCL 50 MG PO TABS
50.0000 mg | ORAL_TABLET | Freq: Every day | ORAL | 1 refills | Status: DC
  Filled 2023-07-13 – 2023-09-08 (×2): qty 90, 90d supply, fill #0
  Filled 2023-12-09: qty 90, 90d supply, fill #1

## 2023-07-13 MED ORDER — CITALOPRAM HYDROBROMIDE 20 MG PO TABS
20.0000 mg | ORAL_TABLET | Freq: Every morning | ORAL | 1 refills | Status: DC
  Filled 2023-07-13 – 2023-09-01 (×2): qty 90, 90d supply, fill #0
  Filled 2023-11-27: qty 90, 90d supply, fill #1

## 2023-07-13 MED ORDER — SIMVASTATIN 40 MG PO TABS
40.0000 mg | ORAL_TABLET | Freq: Every day | ORAL | 1 refills | Status: DC
  Filled 2023-07-13 – 2023-09-30 (×2): qty 90, 90d supply, fill #0
  Filled 2023-10-15 – 2024-01-03 (×4): qty 90, 90d supply, fill #1

## 2023-07-13 MED ORDER — PROLIA 60 MG/ML ~~LOC~~ SOSY
PREFILLED_SYRINGE | SUBCUTANEOUS | 1 refills | Status: DC
  Filled 2023-07-13: qty 1, 30d supply, fill #0
  Filled 2023-07-13: qty 1, 180d supply, fill #0
  Filled 2023-12-24 – 2024-01-11 (×2): qty 1, 30d supply, fill #1

## 2023-07-13 NOTE — Progress Notes (Signed)
 Subjective:  Patient ID: Angela Lam, female    DOB: 08/05/1960  Age: 63 y.o. MRN: [id number]  CC: Hyperlipidemia (Follow up. Patient states that she's having some SOB when she's walking up stairs. )   HPI Angela Lam presents for f/up ----  Discussed the use of AI scribe software for clinical note transcription with the patient, who gave verbal consent to proceed.  History of Present Illness   Angela Lam is a 63 year old female who presents with elevated blood pressure and shortness of breath on exertion.  She has noted a blood pressure reading of 130/90 mmHg, which she considers unusually high. She is not on any antihypertensive medication currently. No headache, blurred vision, chest pain, or shortness of breath at rest. She speculates that consuming a salty baked potato the night before might have contributed to the elevated reading.  She experiences shortness of breath when climbing stairs, occurring intermittently over the past one to two months. This symptom is not associated with any pain and does not occur during treadmill exercise. No cold, clammy sweats, fatigue, or swelling in her legs or feet.  Her family history is significant for hypertension, as her mother and sister have had it. Her mother also died of heart disease at an early age.       Outpatient Medications Prior to Visit  Medication Sig Dispense Refill   BIOTIN PO Take by mouth.     Calcium Carbonate-Vitamin D  600-400 MG-UNIT tablet      cholecalciferol (VITAMIN D3) 25 MCG (1000 UNIT) tablet Take 2,000 Units by mouth daily.     aspirin EC 81 MG tablet Take 81 mg by mouth every other day.     citalopram  (CELEXA ) 20 MG tablet Take 1 tablet (20 mg total) by mouth in the morning. 90 tablet 1   denosumab  (PROLIA ) 60 MG/ML SOSY injection INJECT 60MG  INTO THE SKIN EVERY 6 MONTHS. 1 mL 1   simvastatin  (ZOCOR ) 40 MG tablet Take 1 tablet (40 mg total) by mouth at bedtime. 90 tablet 1   traZODone  (DESYREL ) 50  MG tablet Take 1 tablet (50 mg total) by mouth at bedtime. 90 tablet 0   denosumab  (PROLIA ) injection 60 mg      denosumab  (PROLIA ) injection 60 mg      No facility-administered medications prior to visit.    ROS Review of Systems  Constitutional:  Negative for appetite change, chills, diaphoresis, fatigue and fever.  HENT: Negative.    Eyes: Negative.  Negative for visual disturbance.  Respiratory:  Positive for shortness of breath. Negative for cough, chest tightness and wheezing.   Cardiovascular:  Negative for chest pain, palpitations and leg swelling.  Gastrointestinal: Negative.  Negative for abdominal pain, constipation, diarrhea, nausea and vomiting.  Genitourinary: Negative.  Negative for difficulty urinating and dysuria.  Musculoskeletal:  Negative for arthralgias and myalgias.  Skin: Negative.   Neurological: Negative.  Negative for dizziness and weakness.  Hematological:  Negative for adenopathy. Does not bruise/bleed easily.  Psychiatric/Behavioral: Negative.      Objective:  BP 138/78 (BP Location: Left Arm, Patient Position: Sitting, Cuff Size: Small)   Pulse 85   Temp 98.4 F (36.9 C) (Oral)   Resp 16   Ht 5' (1.524 m)   Wt 133 lb 12.8 oz (60.7 kg)   SpO2 96%   BMI 26.13 kg/m   BP Readings from Last 3 Encounters:  07/13/23 138/78  06/29/23 110/74  01/05/23 114/82    Wt Readings from Last 3  Encounters:  07/13/23 133 lb 12.8 oz (60.7 kg)  06/29/23 136 lb (61.7 kg)  01/05/23 138 lb (62.6 kg)    Physical Exam Vitals reviewed.  Constitutional:      Appearance: Normal appearance.  HENT:     Mouth/Throat:     Mouth: Mucous membranes are moist.  Eyes:     General: No scleral icterus.    Conjunctiva/sclera: Conjunctivae normal.  Cardiovascular:     Rate and Rhythm: Normal rate and regular rhythm.     Heart sounds: No murmur heard.    No friction rub. No gallop.     Comments: EKG--- NSR, 74 bpm No LVH, Q waves, or ST/T wave changes   Unchanged  Pulmonary:     Effort: Pulmonary effort is normal.     Breath sounds: No stridor. No wheezing, rhonchi or rales.  Abdominal:     General: Abdomen is flat.     Palpations: There is no mass.     Tenderness: There is no abdominal tenderness. There is no guarding.     Hernia: No hernia is present.  Musculoskeletal:        General: Normal range of motion.     Cervical back: Neck supple.     Right lower leg: No edema.     Left lower leg: No edema.  Lymphadenopathy:     Cervical: No cervical adenopathy.  Skin:    General: Skin is warm and dry.  Neurological:     General: No focal deficit present.     Mental Status: She is alert. Mental status is at baseline.  Psychiatric:        Mood and Affect: Mood normal.        Behavior: Behavior normal.     Lab Results  Component Value Date   WBC 6.3 07/13/2023   HGB 14.9 07/13/2023   HCT 45.1 07/13/2023   PLT 274.0 07/13/2023   GLUCOSE 98 07/13/2023   CHOL 183 07/13/2023   TRIG 55.0 07/13/2023   HDL 78.50 07/13/2023   LDLCALC 93 07/13/2023   ALT 22 07/13/2023   AST 22 07/13/2023   NA 140 07/13/2023   K 3.8 07/13/2023   CL 104 07/13/2023   CREATININE 0.83 07/13/2023   BUN 13 07/13/2023   CO2 28 07/13/2023   TSH 1.28 01/05/2023    No results found.  Assessment & Plan:   DOE (dyspnea on exertion) - EKG and labs are reassuring. Will evaluate with a CA CT scan. -     Troponin I (High Sensitivity); Future -     Brain natriuretic peptide; Future -     CT CORONARY MORPH W/CTA COR W/SCORE W/CA W/CM &/OR WO/CM; Future -     EKG 12-Lead  Hyperlipidemia with target LDL less than 130- LDL goal achieved. Doing well on the statin  -     Hepatic function panel; Future -     Lipid panel; Future -     Aspirin EC; Take 1 tablet (81 mg total) by mouth every other day.  Dispense: 90 tablet; Refill: 1  Elevated blood pressure reading in office without diagnosis of hypertension- Will monitor. -     Basic metabolic panel with  GFR; Future -     CBC with Differential/Platelet; Future -     Urinalysis, Routine w reflex microscopic; Future  GAD (generalized anxiety disorder) -     Citalopram  Hydrobromide; Take 1 tablet (20 mg total) by mouth in the morning.  Dispense: 90 tablet; Refill: 1 -  traZODone  HCl; Take 1 tablet (50 mg total) by mouth at bedtime.  Dispense: 90 tablet; Refill: 1  Other orders -     Simvastatin ; Take 1 tablet (40 mg total) by mouth at bedtime.  Dispense: 90 tablet; Refill: 1     Follow-up: Return in about 3 months (around 10/13/2023).  Sandra Crouch, MD

## 2023-07-13 NOTE — Patient Instructions (Signed)
 Hypertension, Adult High blood pressure (hypertension) is when the force of blood pumping through the arteries is too strong. The arteries are the blood vessels that carry blood from the heart throughout the body. Hypertension forces the heart to work harder to pump blood and may cause arteries to become narrow or stiff. Untreated or uncontrolled hypertension can lead to a heart attack, heart failure, a stroke, kidney disease, and other problems. A blood pressure reading consists of a higher number over a lower number. Ideally, your blood pressure should be below 120/80. The first ("top") number is called the systolic pressure. It is a measure of the pressure in your arteries as your heart beats. The second ("bottom") number is called the diastolic pressure. It is a measure of the pressure in your arteries as the heart relaxes. What are the causes? The exact cause of this condition is not known. There are some conditions that result in high blood pressure. What increases the risk? Certain factors may make you more likely to develop high blood pressure. Some of these risk factors are under your control, including: Smoking. Not getting enough exercise or physical activity. Being overweight. Having too much fat, sugar, calories, or salt (sodium) in your diet. Drinking too much alcohol. Other risk factors include: Having a personal history of heart disease, diabetes, high cholesterol, or kidney disease. Stress. Having a family history of high blood pressure and high cholesterol. Having obstructive sleep apnea. Age. The risk increases with age. What are the signs or symptoms? High blood pressure may not cause symptoms. Very high blood pressure (hypertensive crisis) may cause: Headache. Fast or irregular heartbeats (palpitations). Shortness of breath. Nosebleed. Nausea and vomiting. Vision changes. Severe chest pain, dizziness, and seizures. How is this diagnosed? This condition is diagnosed by  measuring your blood pressure while you are seated, with your arm resting on a flat surface, your legs uncrossed, and your feet flat on the floor. The cuff of the blood pressure monitor will be placed directly against the skin of your upper arm at the level of your heart. Blood pressure should be measured at least twice using the same arm. Certain conditions can cause a difference in blood pressure between your right and left arms. If you have a high blood pressure reading during one visit or you have normal blood pressure with other risk factors, you may be asked to: Return on a different day to have your blood pressure checked again. Monitor your blood pressure at home for 1 week or longer. If you are diagnosed with hypertension, you may have other blood or imaging tests to help your health care provider understand your overall risk for other conditions. How is this treated? This condition is treated by making healthy lifestyle changes, such as eating healthy foods, exercising more, and reducing your alcohol intake. You may be referred for counseling on a healthy diet and physical activity. Your health care provider may prescribe medicine if lifestyle changes are not enough to get your blood pressure under control and if: Your systolic blood pressure is above 130. Your diastolic blood pressure is above 80. Your personal target blood pressure may vary depending on your medical conditions, your age, and other factors. Follow these instructions at home: Eating and drinking  Eat a diet that is high in fiber and potassium, and low in sodium, added sugar, and fat. An example of this eating plan is called the DASH diet. DASH stands for Dietary Approaches to Stop Hypertension. To eat this way: Eat  plenty of fresh fruits and vegetables. Try to fill one half of your plate at each meal with fruits and vegetables. Eat whole grains, such as whole-wheat pasta, brown rice, or whole-grain bread. Fill about one  fourth of your plate with whole grains. Eat or drink low-fat dairy products, such as skim milk or low-fat yogurt. Avoid fatty cuts of meat, processed or cured meats, and poultry with skin. Fill about one fourth of your plate with lean proteins, such as fish, chicken without skin, beans, eggs, or tofu. Avoid pre-made and processed foods. These tend to be higher in sodium, added sugar, and fat. Reduce your daily sodium intake. Many people with hypertension should eat less than 1,500 mg of sodium a day. Do not drink alcohol if: Your health care provider tells you not to drink. You are pregnant, may be pregnant, or are planning to become pregnant. If you drink alcohol: Limit how much you have to: 0-1 drink a day for women. 0-2 drinks a day for men. Know how much alcohol is in your drink. In the [place] one drink equals one 12 oz bottle of beer (355 mL), one 5 oz glass of wine (148 mL), or one 1 oz glass of hard liquor (44 mL). Lifestyle  Work with your health care provider to maintain a healthy body weight or to lose weight. Ask what an ideal weight is for you. Get at least 30 minutes of exercise that causes your heart to beat faster (aerobic exercise) most days of the week. Activities may include walking, swimming, or biking. Include exercise to strengthen your muscles (resistance exercise), such as Pilates or lifting weights, as part of your weekly exercise routine. Try to do these types of exercises for 30 minutes at least 3 days a week. Do not use any products that contain nicotine or tobacco. These products include cigarettes, chewing tobacco, and vaping devices, such as e-cigarettes. If you need help quitting, ask your health care provider. Monitor your blood pressure at home as told by your health care provider. Keep all follow-up visits. This is important. Medicines Take over-the-counter and prescription medicines only as told by your health care provider. Follow directions carefully. Blood  pressure medicines must be taken as prescribed. Do not skip doses of blood pressure medicine. Doing this puts you at risk for problems and can make the medicine less effective. Ask your health care provider about side effects or reactions to medicines that you should watch for. Contact a health care provider if you: Think you are having a reaction to a medicine you are taking. Have headaches that keep coming back (recurring). Feel dizzy. Have swelling in your ankles. Have trouble with your vision. Get help right away if you: Develop a severe headache or confusion. Have unusual weakness or numbness. Feel faint. Have severe pain in your chest or abdomen. Vomit repeatedly. Have trouble breathing. These symptoms may be an emergency. Get help right away. Call 911. Do not wait to see if the symptoms will go away. Do not drive yourself to the hospital. Summary Hypertension is when the force of blood pumping through your arteries is too strong. If this condition is not controlled, it may put you at risk for serious complications. Your personal target blood pressure may vary depending on your medical conditions, your age, and other factors. For most people, a normal blood pressure is less than 120/80. Hypertension is treated with lifestyle changes, medicines, or a combination of both. Lifestyle changes include losing weight, eating a healthy,  low-sodium diet, exercising more, and limiting alcohol. This information is not intended to replace advice given to you by your health care provider. Make sure you discuss any questions you have with your health care provider. Document Revised: 12/25/2020 Document Reviewed: 12/25/2020 Elsevier Patient Education  2024 [employer].

## 2023-07-13 NOTE — Progress Notes (Signed)
 Specialty Pharmacy Initial Fill Coordination Note  Angela Lam is a 63 y.o. female contacted today regarding initial fill of specialty medication(s) Denosumab  (Prolia )   Patient requested Courier to Provider Office   Delivery date: 07/14/23   Verified address: [facility] [address]   Medication will be filled on 5/12.   Patient is aware of $0 copayment.

## 2023-07-13 NOTE — Telephone Encounter (Signed)
 Sp[place]w with patient and she is reaching out to the pharmacy to make sure they are shipping Prolia  to the office.

## 2023-07-13 NOTE — Progress Notes (Signed)
 S:  Patient presents for review of their specialty medication therapy.  Patient is on Prolia  for osteoporosis. Patient is managed by Dr. Rosalea Collin for this. Last BMD 07/13/2023.  Adherence: confirms  Efficacy: reports good results with the medication   Dosing: 60mg  q5months  Dose adjustments: Renal: Monitor patients with severe impairment (CrCl <30 mL/minute or on dialysis) closely, as significant and prolonged hypocalcemia (incidence of 29% and potentially lasting weeks to months) and marked elevations of serum parathyroid hormone are serious risks in this population. Ensure adequate calcium and vitamin D  intake/supplementation. CrCl >=30 mL/minute: No dosage adjustment necessary. CrCl <30 mL/minute: No dosage adjustment necessary; use in conjunction with guidance from patient's nephrology team. Hepatic: no dose adjustments (has not been studied)  Drug-drug interactions: none identified   Screening: TB test: completed  Hepatitis: completed   Monitoring: S/sx of infection: none  S/sx of hypersensitivity: none  S/sx of hypocalcemia/hypercalcemia: none  Dermatitis/skin rash: none  Peripheral edema: none  HA: none  GI upset: none   Other side effects:  -Endorses hair loss since starting the Prolia . Plans to discuss this with her specialist but will remain on the medication for now.   Last bone density study: today  O:      Lab Results  Component Value Date   WBC 6.3 07/13/2023   HGB 14.9 07/13/2023   HCT 45.1 07/13/2023   MCV 102.1 (H) 07/13/2023   PLT 274.0 07/13/2023      Chemistry      Component Value Date/Time   NA 140 07/13/2023 0847   NA 141 05/26/2019 0000   K 3.8 07/13/2023 0847   CL 104 07/13/2023 0847   CO2 28 07/13/2023 0847   BUN 13 07/13/2023 0847   BUN 17 05/26/2019 0000   CREATININE 0.83 07/13/2023 0847   GLU 75 05/26/2019 0000      Component Value Date/Time   CALCIUM 9.1 07/13/2023 0847   ALKPHOS 18 (L) 07/13/2023 0847   AST 22 07/13/2023  0847   ALT 22 07/13/2023 0847   BILITOT 0.6 07/13/2023 0847     A/P: 1. Medication review: Patient is taking Prolia  for osteoporosis. Reviewed the medication with the patient, including the following: Prolia  (denosumab ) is a monoclonal antibody with affinity for nuclear factor-kappa ligand (RANKL). Prolia  binds to RANKL and prevents osteoclast formation, leading to decreased bone resorption and increased bone mass in osteoporosis. Patient educated on purpose, proper use, and potential adverse effects of Prolia . The most common adverse effects are hypersensitivities, peripheral edema, dermatitis/skin rash, GI upset, HA, joint pain, and infection. There is the possibility of atypical femur fracture, serum calcium disturbances, and osteonecrosis of the jaw. Patients should monitor for and report hip, thigh, or groin pain. Additionally, patients should monitor for and report jaw pain, tooth/periodontal infection, toothache, and/or gingival ulceration/erosion. Prolia  exists as a solution prefilled syringe for SQ administration. Administration: Denosumab  is intended for SubQ route only and should not be administered IV, IM, or intradermally. Prior to administration, bring to room temperature in original container (allow to stand ~15 to 30 minutes); do not warm by any other method. Solution may contain trace amounts of translucent to white protein particles; do not use if cloudy, discolored (normal solution should be clear and colorless to pale yellow), or contains excessive particles or foreign matter. Avoid vigorous shaking. Administer via SubQ injection in the upper arm, upper thigh, or abdomen; should only be administered by a health care professional. No recommendations for any changes at this time.  Marene Shape, PharmD, Becky Bowels, CPP Clinical Pharmacist [facility] [phone]

## 2023-07-14 ENCOUNTER — Other Ambulatory Visit (HOSPITAL_COMMUNITY): Payer: Self-pay

## 2023-07-14 ENCOUNTER — Telehealth (HOSPITAL_COMMUNITY): Payer: Self-pay | Admitting: *Deleted

## 2023-07-14 ENCOUNTER — Telehealth: Payer: Self-pay

## 2023-07-14 MED ORDER — METOPROLOL TARTRATE 50 MG PO TABS
ORAL_TABLET | ORAL | 0 refills | Status: DC
  Filled 2023-07-14: qty 1, 1d supply, fill #0

## 2023-07-14 NOTE — Telephone Encounter (Signed)
 Reaching out to patient to offer assistance regarding upcoming cardiac imaging study; pt verbalizes understanding of appt date/time, parking situation and where to check in, pre-test NPO status and medications ordered, and verified current allergies; name and call back number provided for further questions should they arise  Angela Brick RN Navigator Cardiac Imaging [facility] [phone] office [phone] cell  Patient to take 50mg  metoprolol tartrate two hours prior to her cardiac CT scan.

## 2023-07-14 NOTE — Telephone Encounter (Signed)
 Contact patient to schedule appointment. Prolia  injection has arrived.

## 2023-07-15 ENCOUNTER — Ambulatory Visit (HOSPITAL_COMMUNITY)
Admission: RE | Admit: 2023-07-15 | Discharge: 2023-07-15 | Disposition: A | Discharge status: Home / Self Care | Source: Ambulatory Visit | Attending: Internal Medicine | Admitting: Internal Medicine

## 2023-07-15 DIAGNOSIS — R0609 Other forms of dyspnea: Secondary | ICD-10-CM | POA: Insufficient documentation

## 2023-07-15 DIAGNOSIS — I2510 Atherosclerotic heart disease of native coronary artery without angina pectoris: Secondary | ICD-10-CM | POA: Diagnosis not present

## 2023-07-15 MED ORDER — NITROGLYCERIN 0.4 MG SL SUBL
0.8000 mg | SUBLINGUAL_TABLET | Freq: Once | SUBLINGUAL | Status: AC
  Administered 2023-07-15: 0.8 mg via SUBLINGUAL

## 2023-07-15 MED ORDER — IOHEXOL 350 MG/ML SOLN
100.0000 mL | Freq: Once | INTRAVENOUS | Status: AC | PRN
  Administered 2023-07-15: 100 mL via INTRAVENOUS

## 2023-07-15 MED ORDER — IOHEXOL 350 MG/ML SOLN
100.0000 mL | Freq: Once | INTRAVENOUS | Status: DC | PRN
Start: 2023-07-15 — End: 2023-07-16

## 2023-07-16 ENCOUNTER — Other Ambulatory Visit: Payer: Self-pay

## 2023-07-16 ENCOUNTER — Ambulatory Visit: Payer: Self-pay | Admitting: Internal Medicine

## 2023-07-17 ENCOUNTER — Ambulatory Visit (INDEPENDENT_AMBULATORY_CARE_PROVIDER_SITE_OTHER)

## 2023-07-17 VITALS — BP 128/70 | HR 76 | Resp 20 | Ht 60.0 in | Wt 134.8 lb

## 2023-07-17 DIAGNOSIS — M810 Age-related osteoporosis without current pathological fracture: Secondary | ICD-10-CM | POA: Diagnosis not present

## 2023-07-17 MED ORDER — DENOSUMAB 60 MG/ML ~~LOC~~ SOSY
60.0000 mg | PREFILLED_SYRINGE | Freq: Once | SUBCUTANEOUS | Status: AC
  Administered 2024-01-25: 60 mg via SUBCUTANEOUS

## 2023-07-17 MED ORDER — DENOSUMAB 60 MG/ML ~~LOC~~ SOSY
60.0000 mg | PREFILLED_SYRINGE | Freq: Once | SUBCUTANEOUS | Status: AC
  Administered 2023-07-17: 60 mg via SUBCUTANEOUS

## 2023-07-17 NOTE — Progress Notes (Signed)
 After obtaining consent, and per orders of Dr. Lonzo Cloud, injection of Prolia given by Tera Partridge. Patient instructed to remain in clinic for 20 minutes afterwards, and to report any adverse reaction to me immediately.

## 2023-07-21 ENCOUNTER — Ambulatory Visit: Payer: Self-pay | Admitting: Internal Medicine

## 2023-07-22 NOTE — Telephone Encounter (Signed)
 Discussed DXA scan results with patient on 07/22/2023 at 1545   DXA scan showed osteopenia with improvement at the hip and femoral neck   Patient opted to remain on Prolia , her loss has been tolerable

## 2023-08-07 NOTE — Telephone Encounter (Signed)
 Last Prolia  inj 07/17/23 Next Prolia  inj due 01/18/24

## 2023-09-01 ENCOUNTER — Other Ambulatory Visit (HOSPITAL_COMMUNITY): Payer: Self-pay

## 2023-09-08 ENCOUNTER — Other Ambulatory Visit (HOSPITAL_COMMUNITY): Payer: Self-pay

## 2023-09-08 ENCOUNTER — Other Ambulatory Visit: Payer: Self-pay

## 2023-09-30 ENCOUNTER — Other Ambulatory Visit: Payer: Self-pay

## 2023-09-30 ENCOUNTER — Other Ambulatory Visit (HOSPITAL_COMMUNITY): Payer: Self-pay

## 2023-10-01 ENCOUNTER — Other Ambulatory Visit (HOSPITAL_COMMUNITY): Payer: Self-pay

## 2023-10-14 ENCOUNTER — Ambulatory Visit: Admitting: Internal Medicine

## 2023-10-14 IMAGING — DX DG FOOT COMPLETE 3+V*L*
3 series · 3 of 3 positions shown · non-contrast
Comparison: None Available.

CLINICAL DATA: Acute LEFT foot pain following injury yesterday.
Initial encounter.

EXAM:
LEFT FOOT - COMPLETE 3 VIEW

[foot ap]
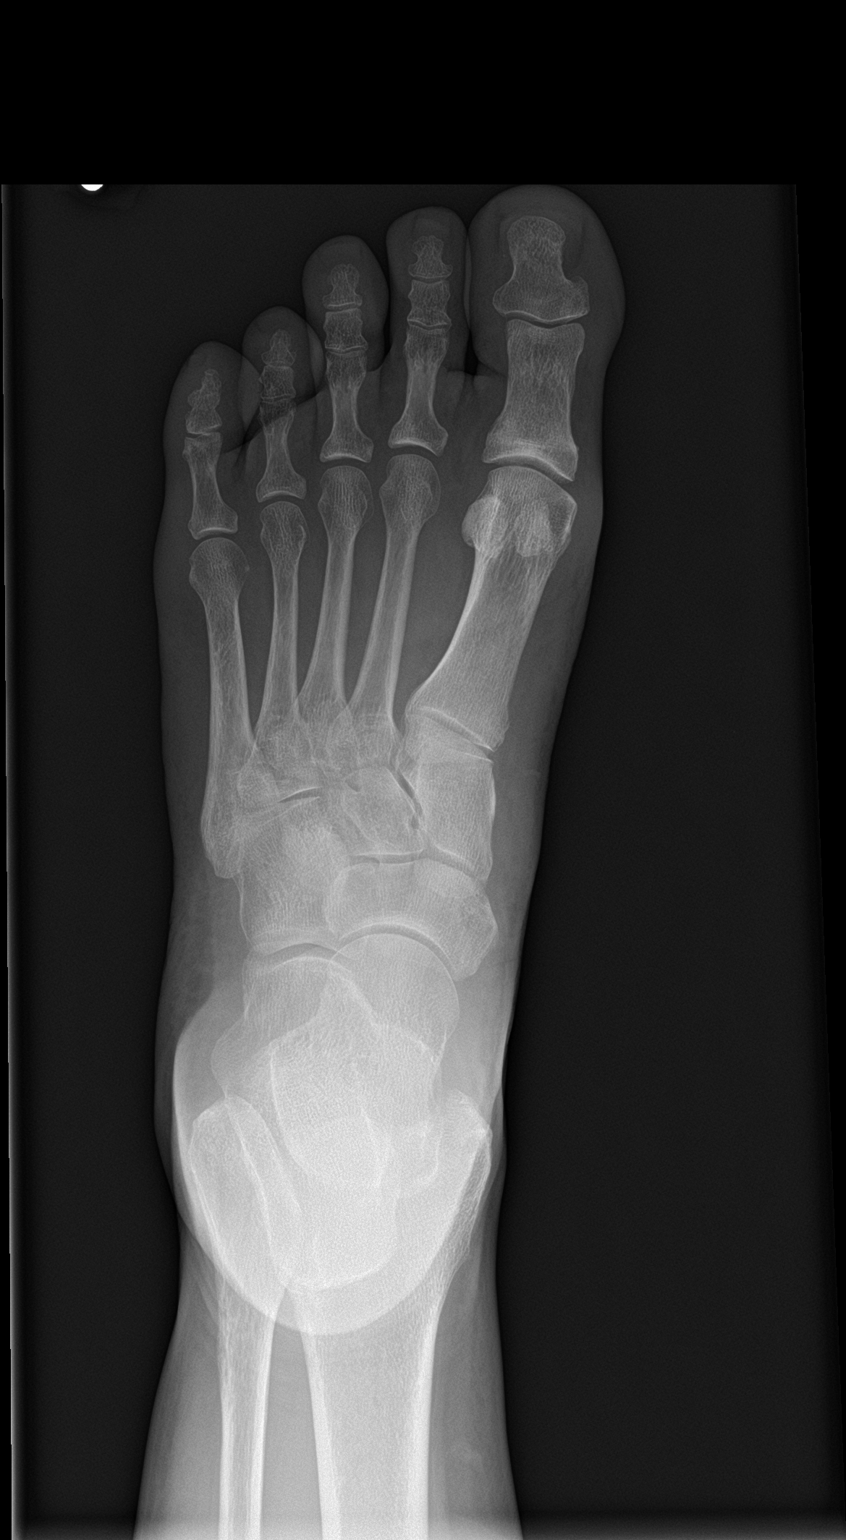

[foot obl]
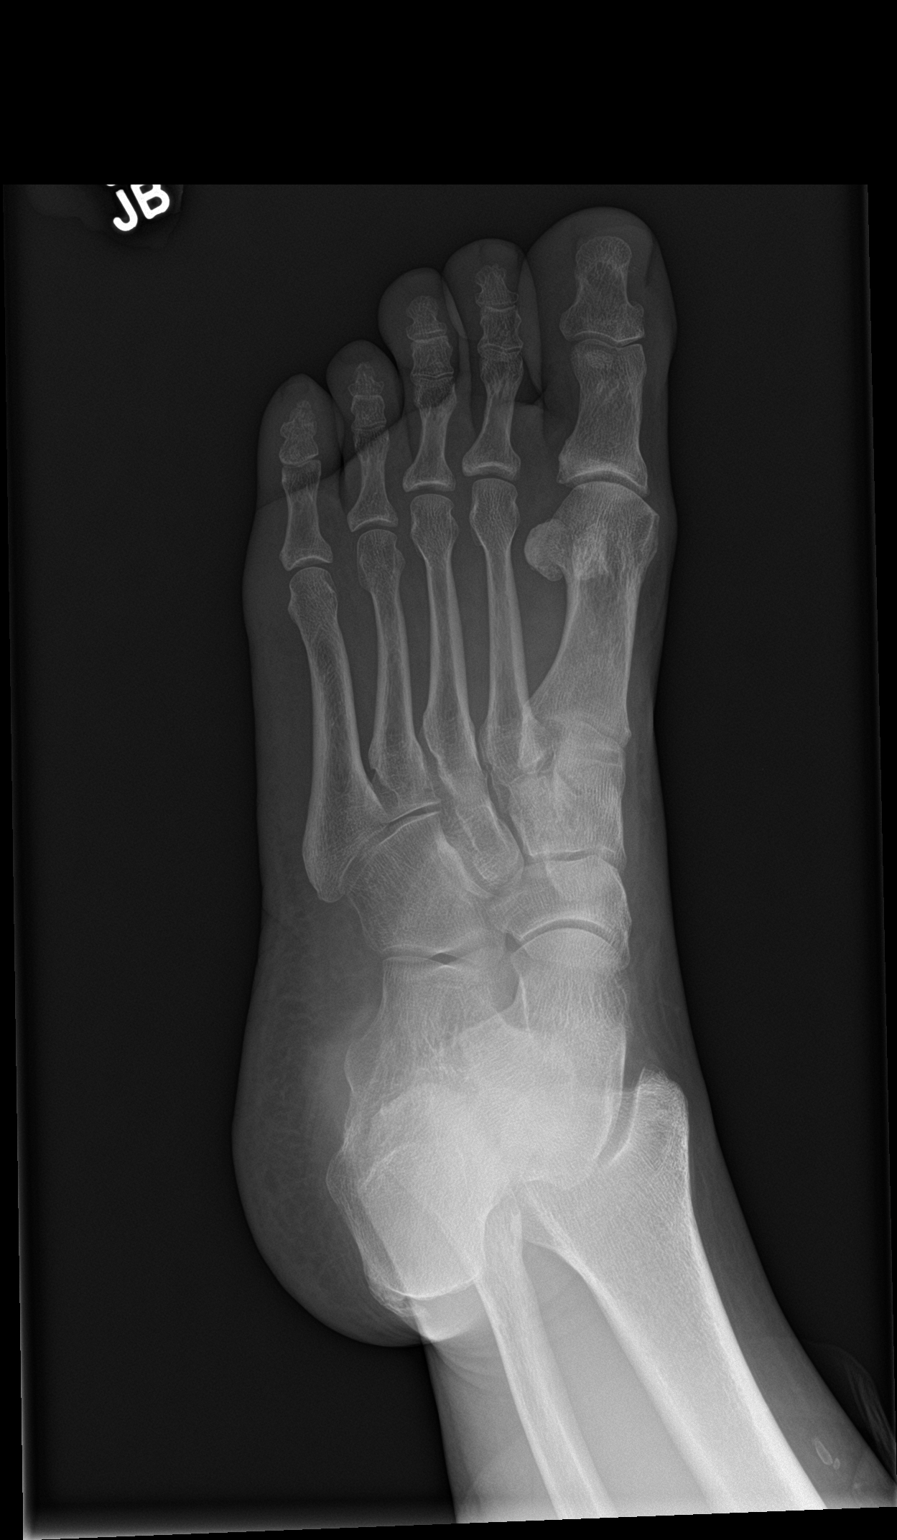

[foot lat]
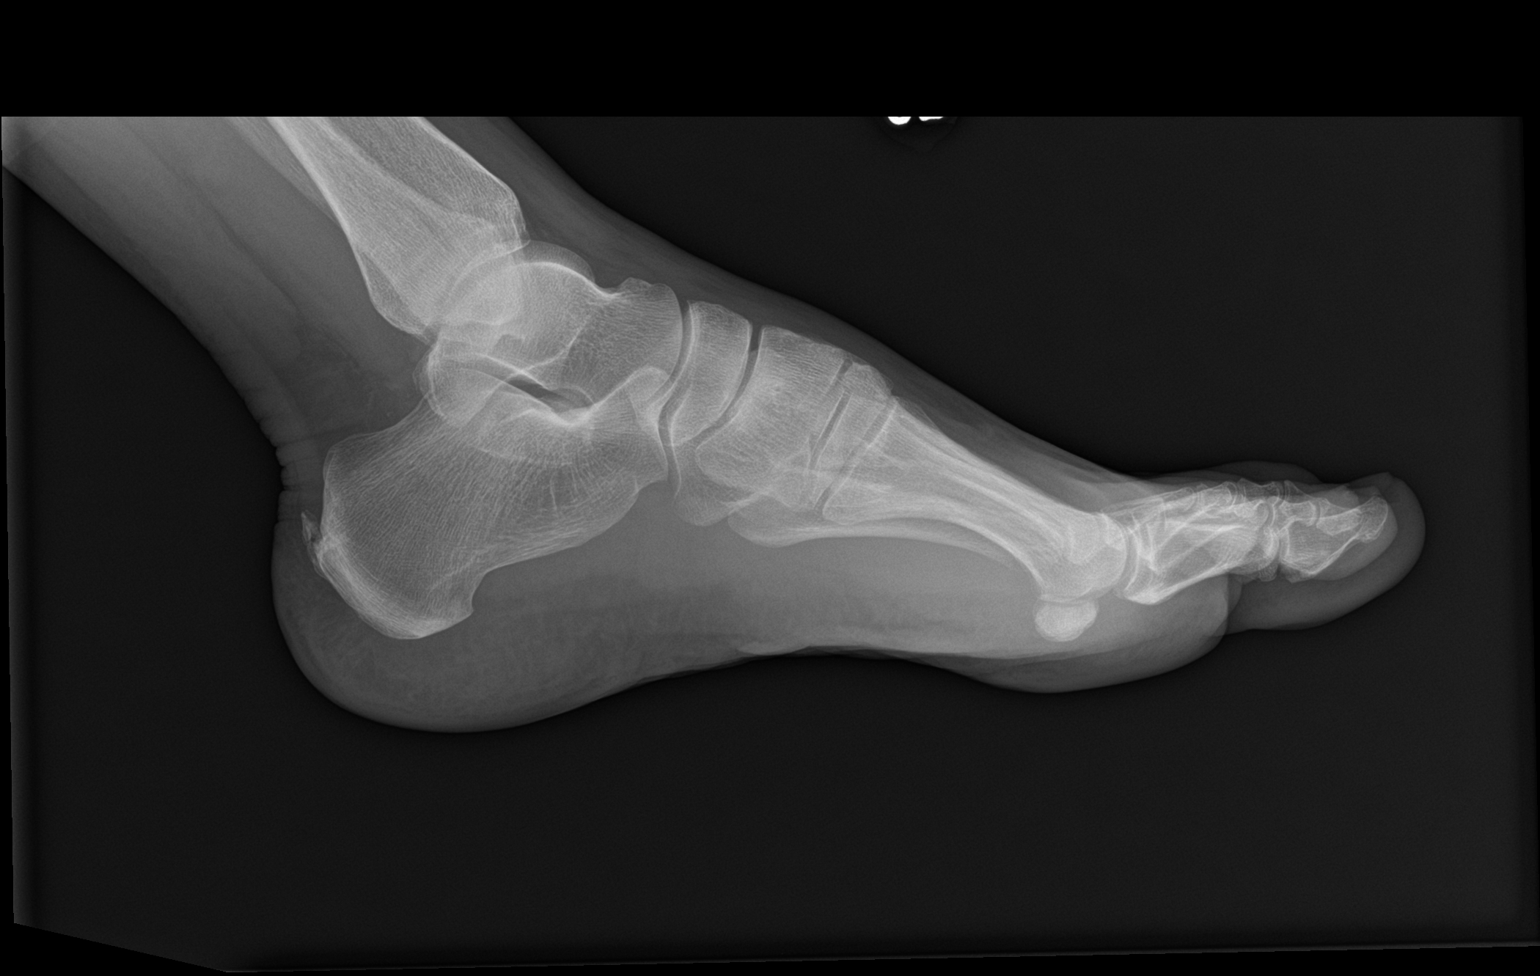

[3 of 3 positions shown; findings below may reference images not displayed]

FINDINGS: There is no evidence of fracture or dislocation. There is no
evidence of arthropathy or other focal bone abnormality. Soft
tissues are unremarkable.
IMPRESSION: Negative.

## 2023-10-15 ENCOUNTER — Other Ambulatory Visit (HOSPITAL_COMMUNITY): Payer: Self-pay

## 2023-10-16 ENCOUNTER — Other Ambulatory Visit (HOSPITAL_COMMUNITY): Payer: Self-pay

## 2023-10-19 ENCOUNTER — Other Ambulatory Visit (HOSPITAL_COMMUNITY): Payer: Self-pay

## 2023-12-24 ENCOUNTER — Other Ambulatory Visit: Payer: Self-pay

## 2024-01-02 NOTE — Telephone Encounter (Signed)
 Prolia  VOB initiated via [email or web address]  Next Prolia  inj DUE: 01/18/24

## 2024-01-02 NOTE — Telephone Encounter (Signed)
 Last Prolia  inj 07/17/23 Next Prolia  inj due 01/18/24

## 2024-01-04 ENCOUNTER — Other Ambulatory Visit (HOSPITAL_COMMUNITY): Payer: Self-pay

## 2024-01-11 ENCOUNTER — Other Ambulatory Visit: Payer: Self-pay

## 2024-01-11 ENCOUNTER — Other Ambulatory Visit: Payer: Self-pay | Admitting: Pharmacy Technician

## 2024-01-11 NOTE — Progress Notes (Signed)
 Specialty Pharmacy Refill Coordination Note  Angela Lam is a 63 y.o. female assessed today regarding refills of clinic administered specialty medication(s) Denosumab  (PROLIA )   Clinic requested Courier to Provider Office   Delivery date: 01/18/24   Verified address: [facility] [address]   Medication will be filled on: 01/15/24

## 2024-01-11 NOTE — Telephone Encounter (Signed)
 [employer] [employer] - Prior Authorization REQUIRED for [employer]

## 2024-01-13 ENCOUNTER — Other Ambulatory Visit: Payer: Self-pay

## 2024-01-13 MED ORDER — PROLIA 60 MG/ML ~~LOC~~ SOSY
PREFILLED_SYRINGE | SUBCUTANEOUS | 1 refills | Status: DC
  Filled 2024-01-14 (×3): qty 1, 30d supply, fill #0

## 2024-01-13 NOTE — Telephone Encounter (Signed)
 Rx for Prolia  sent to [facility], participates in the [facility] Specialty Pharmacy Program.

## 2024-01-14 ENCOUNTER — Other Ambulatory Visit: Payer: Self-pay

## 2024-01-19 ENCOUNTER — Ambulatory Visit

## 2024-01-22 ENCOUNTER — Ambulatory Visit

## 2024-01-22 VITALS — BP 146/60 | HR 95 | Resp 16 | Ht 60.0 in | Wt 136.4 lb

## 2024-01-22 DIAGNOSIS — M810 Age-related osteoporosis without current pathological fracture: Secondary | ICD-10-CM | POA: Diagnosis not present

## 2024-01-22 DIAGNOSIS — E0590 Thyrotoxicosis, unspecified without thyrotoxic crisis or storm: Secondary | ICD-10-CM

## 2024-01-22 DIAGNOSIS — E063 Autoimmune thyroiditis: Secondary | ICD-10-CM

## 2024-01-22 MED ORDER — DENOSUMAB 60 MG/ML ~~LOC~~ SOSY
60.0000 mg | PREFILLED_SYRINGE | Freq: Once | SUBCUTANEOUS | Status: AC
  Administered 2024-01-22: 60 mg via SUBCUTANEOUS

## 2024-01-22 NOTE — Progress Notes (Signed)
 After obtaining consent, and per orders of Dr. Sam, injection of Prolia given by Dietrich LOISE Lax. Patient instructed to remain in clinic for 20 minutes afterwards, and to report any adverse reaction to me immediately.

## 2024-01-26 MED ORDER — DENOSUMAB 60 MG/ML ~~LOC~~ SOSY: 60.0000 mg | PREFILLED_SYRINGE | Freq: Once | SUBCUTANEOUS | Status: AC

## 2024-01-26 NOTE — Addendum Note (Signed)
 Addended by: ARELIA DIETRICH SAILOR on: 01/26/2024 11:41 AM   Modules accepted: Orders

## 2024-02-04 ENCOUNTER — Other Ambulatory Visit: Payer: Self-pay

## 2024-02-09 NOTE — Telephone Encounter (Signed)
 Last Prolia  inj 01/22/24 Next Prolia  inj due 07/22/24

## 2024-02-23 ENCOUNTER — Other Ambulatory Visit: Payer: Self-pay

## 2024-02-23 ENCOUNTER — Other Ambulatory Visit: Payer: Self-pay | Admitting: Internal Medicine

## 2024-02-23 ENCOUNTER — Other Ambulatory Visit (HOSPITAL_COMMUNITY): Payer: Self-pay

## 2024-02-23 DIAGNOSIS — F411 Generalized anxiety disorder: Secondary | ICD-10-CM

## 2024-02-23 MED ORDER — CITALOPRAM HYDROBROMIDE 20 MG PO TABS
20.0000 mg | ORAL_TABLET | Freq: Every morning | ORAL | 0 refills | Status: DC
  Filled 2024-02-23: qty 90, 90d supply, fill #0

## 2024-03-06 ENCOUNTER — Other Ambulatory Visit: Payer: Self-pay | Admitting: Internal Medicine

## 2024-03-06 DIAGNOSIS — F411 Generalized anxiety disorder: Secondary | ICD-10-CM

## 2024-03-07 ENCOUNTER — Other Ambulatory Visit: Payer: Self-pay

## 2024-03-07 MED ORDER — PROLIA 60 MG/ML ~~LOC~~ SOSY
PREFILLED_SYRINGE | SUBCUTANEOUS | 1 refills | Status: AC
  Filled 2024-03-07: qty 1, fill #0

## 2024-03-09 ENCOUNTER — Encounter (HOSPITAL_COMMUNITY): Payer: Self-pay

## 2024-03-09 ENCOUNTER — Encounter: Payer: Self-pay | Admitting: Internal Medicine

## 2024-03-09 ENCOUNTER — Other Ambulatory Visit (HOSPITAL_COMMUNITY): Payer: Self-pay

## 2024-03-10 ENCOUNTER — Other Ambulatory Visit (HOSPITAL_COMMUNITY): Payer: Self-pay

## 2024-03-10 ENCOUNTER — Other Ambulatory Visit: Payer: Self-pay

## 2024-03-10 DIAGNOSIS — F411 Generalized anxiety disorder: Secondary | ICD-10-CM

## 2024-03-10 MED ORDER — TRAZODONE HCL 50 MG PO TABS
50.0000 mg | ORAL_TABLET | Freq: Every day | ORAL | 0 refills | Status: AC
  Filled 2024-03-10: qty 28, 28d supply, fill #0

## 2024-03-11 ENCOUNTER — Other Ambulatory Visit: Payer: Self-pay | Admitting: Internal Medicine

## 2024-03-11 ENCOUNTER — Other Ambulatory Visit (HOSPITAL_COMMUNITY): Payer: Self-pay

## 2024-03-11 DIAGNOSIS — F411 Generalized anxiety disorder: Secondary | ICD-10-CM

## 2024-03-11 DIAGNOSIS — E785 Hyperlipidemia, unspecified: Secondary | ICD-10-CM

## 2024-03-24 ENCOUNTER — Other Ambulatory Visit: Payer: Self-pay

## 2024-03-28 ENCOUNTER — Other Ambulatory Visit: Payer: Self-pay | Admitting: Internal Medicine

## 2024-03-28 DIAGNOSIS — E785 Hyperlipidemia, unspecified: Secondary | ICD-10-CM

## 2024-03-29 ENCOUNTER — Other Ambulatory Visit (HOSPITAL_COMMUNITY): Payer: Self-pay

## 2024-03-29 MED ORDER — SIMVASTATIN 40 MG PO TABS
40.0000 mg | ORAL_TABLET | Freq: Every day | ORAL | 1 refills | Status: AC
  Filled 2024-03-29: qty 90, 90d supply, fill #0

## 2024-04-07 ENCOUNTER — Ambulatory Visit: Payer: Self-pay | Admitting: Internal Medicine

## 2024-04-07 ENCOUNTER — Encounter: Payer: Self-pay | Admitting: Internal Medicine

## 2024-04-07 ENCOUNTER — Other Ambulatory Visit: Payer: Self-pay

## 2024-04-07 ENCOUNTER — Ambulatory Visit: Admitting: Internal Medicine

## 2024-04-07 ENCOUNTER — Other Ambulatory Visit (HOSPITAL_COMMUNITY): Payer: Self-pay

## 2024-04-07 VITALS — BP 128/86 | HR 88 | Temp 98.5°F | Resp 16 | Ht 60.0 in | Wt 138.0 lb

## 2024-04-07 DIAGNOSIS — Z23 Encounter for immunization: Secondary | ICD-10-CM | POA: Insufficient documentation

## 2024-04-07 DIAGNOSIS — F411 Generalized anxiety disorder: Secondary | ICD-10-CM

## 2024-04-07 DIAGNOSIS — Z0001 Encounter for general adult medical examination with abnormal findings: Secondary | ICD-10-CM

## 2024-04-07 DIAGNOSIS — M810 Age-related osteoporosis without current pathological fracture: Secondary | ICD-10-CM

## 2024-04-07 DIAGNOSIS — Z1231 Encounter for screening mammogram for malignant neoplasm of breast: Secondary | ICD-10-CM

## 2024-04-07 DIAGNOSIS — E785 Hyperlipidemia, unspecified: Secondary | ICD-10-CM

## 2024-04-07 LAB — CBC WITH DIFFERENTIAL/PLATELET
Basophils Absolute: 0 10*3/uL (ref 0.0–0.1)
Basophils Relative: 0.6 % (ref 0.0–3.0)
Eosinophils Absolute: 0.1 10*3/uL (ref 0.0–0.7)
Eosinophils Relative: 1.5 % (ref 0.0–5.0)
HCT: 43.9 % (ref 36.0–46.0)
Hemoglobin: 14.8 g/dL (ref 12.0–15.0)
Lymphocytes Relative: 19.9 % (ref 12.0–46.0)
Lymphs Abs: 1.4 10*3/uL (ref 0.7–4.0)
MCHC: 33.6 g/dL (ref 30.0–36.0)
MCV: 100.2 fl — ABNORMAL HIGH (ref 78.0–100.0)
Monocytes Absolute: 0.3 10*3/uL (ref 0.1–1.0)
Monocytes Relative: 4.9 % (ref 3.0–12.0)
Neutro Abs: 5.2 10*3/uL (ref 1.4–7.7)
Neutrophils Relative %: 73.1 % (ref 43.0–77.0)
Platelets: 288 10*3/uL (ref 150.0–400.0)
RBC: 4.39 Mil/uL (ref 3.87–5.11)
RDW: 13 % (ref 11.5–15.5)
WBC: 7.1 10*3/uL (ref 4.0–10.5)

## 2024-04-07 LAB — HEPATIC FUNCTION PANEL
ALT: 23 U/L (ref 3–35)
AST: 22 U/L (ref 5–37)
Albumin: 4.1 g/dL (ref 3.5–5.2)
Alkaline Phosphatase: 16 U/L — ABNORMAL LOW (ref 39–117)
Bilirubin, Direct: 0 mg/dL — ABNORMAL LOW (ref 0.1–0.3)
Total Bilirubin: 0.4 mg/dL (ref 0.2–1.2)
Total Protein: 7 g/dL (ref 6.0–8.3)

## 2024-04-07 LAB — LIPID PANEL
Cholesterol: 169 mg/dL (ref 28–200)
HDL: 82.9 mg/dL
LDL Cholesterol: 74 mg/dL (ref 10–99)
NonHDL: 86.43
Total CHOL/HDL Ratio: 2
Triglycerides: 60 mg/dL (ref 10.0–149.0)
VLDL: 12 mg/dL (ref 0.0–40.0)

## 2024-04-07 LAB — BASIC METABOLIC PANEL WITH GFR
BUN: 17 mg/dL (ref 6–23)
CO2: 32 meq/L (ref 19–32)
Calcium: 9.7 mg/dL (ref 8.4–10.5)
Chloride: 103 meq/L (ref 96–112)
Creatinine, Ser: 0.72 mg/dL (ref 0.40–1.20)
GFR: 89.38 mL/min
Glucose, Bld: 90 mg/dL (ref 70–99)
Potassium: 4 meq/L (ref 3.5–5.1)
Sodium: 141 meq/L (ref 135–145)

## 2024-04-07 LAB — PHOSPHORUS: Phosphorus: 3.2 mg/dL (ref 2.3–4.6)

## 2024-04-07 LAB — VITAMIN D 25 HYDROXY (VIT D DEFICIENCY, FRACTURES): VITD: 57.01 ng/mL (ref 30.00–100.00)

## 2024-04-07 LAB — TSH: TSH: 1.4 u[IU]/mL (ref 0.35–5.50)

## 2024-04-07 MED ORDER — FOSTEUM PLUS PO CAPS: 1.0000 | ORAL_CAPSULE | Freq: Two times a day (BID) | ORAL | 11 refills | Status: AC

## 2024-04-07 MED ORDER — CITALOPRAM HYDROBROMIDE 20 MG PO TABS
20.0000 mg | ORAL_TABLET | Freq: Every morning | ORAL | 1 refills | Status: AC
  Filled 2024-04-07: qty 90, 90d supply, fill #0

## 2024-04-07 NOTE — Patient Instructions (Signed)
 Health Maintenance, Female Adopting a healthy lifestyle and getting preventive care are important in promoting health and wellness. Ask your health care provider about: The right schedule for you to have regular tests and exams. Things you can do on your own to prevent diseases and keep yourself healthy. What should I know about diet, weight, and exercise? Eat a healthy diet  Eat a diet that includes plenty of vegetables, fruits, low-fat dairy products, and lean protein. Do not eat a lot of foods that are high in solid fats, added sugars, or sodium. Maintain a healthy weight Body mass index (BMI) is used to identify weight problems. It estimates body fat based on height and weight. Your health care provider can help determine your BMI and help you achieve or maintain a healthy weight. Get regular exercise Get regular exercise. This is one of the most important things you can do for your health. Most adults should: Exercise for at least 150 minutes each week. The exercise should increase your heart rate and make you sweat (moderate-intensity exercise). Do strengthening exercises at least twice a week. This is in addition to the moderate-intensity exercise. Spend less time sitting. Even light physical activity can be beneficial. Watch cholesterol and blood lipids Have your blood tested for lipids and cholesterol at 63 years of age, then have this test every 5 years. Have your cholesterol levels checked more often if: Your lipid or cholesterol levels are high. You are older than 63 years of age. You are at high risk for heart disease. What should I know about cancer screening? Depending on your health history and family history, you may need to have cancer screening at various ages. This may include screening for: Breast cancer. Cervical cancer. Colorectal cancer. Skin cancer. Lung cancer. What should I know about heart disease, diabetes, and high blood pressure? Blood pressure and heart  disease High blood pressure causes heart disease and increases the risk of stroke. This is more likely to develop in people who have high blood pressure readings or are overweight. Have your blood pressure checked: Every 3-5 years if you are 32-37 years of age. Every year if you are 71 years old or older. Diabetes Have regular diabetes screenings. This checks your fasting blood sugar level. Have the screening done: Once every three years after age 24 if you are at a normal weight and have a low risk for diabetes. More often and at a younger age if you are overweight or have a high risk for diabetes. What should I know about preventing infection? Hepatitis B If you have a higher risk for hepatitis B, you should be screened for this virus. Talk with your health care provider to find out if you are at risk for hepatitis B infection. Hepatitis C Testing is recommended for: Everyone born from [birth year] through 1964. Anyone with known risk factors for hepatitis C. Sexually transmitted infections (STIs) Get screened for STIs, including gonorrhea and chlamydia, if: You are sexually active and are younger than 63 years of age. You are older than 63 years of age and your health care provider tells you that you are at risk for this type of infection. Your sexual activity has changed since you were last screened, and you are at increased risk for chlamydia or gonorrhea. Ask your health care provider if you are at risk. Ask your health care provider about whether you are at high risk for HIV. Your health care provider may recommend a prescription medicine to help prevent HIV  infection. If you choose to take medicine to prevent HIV, you should first get tested for HIV. You should then be tested every 3 months for as long as you are taking the medicine. Pregnancy If you are about to stop having your period (premenopausal) and you may become pregnant, seek counseling before you get pregnant. Take 400 to 800  micrograms (mcg) of folic acid every day if you become pregnant. Ask for birth control (contraception) if you want to prevent pregnancy. Osteoporosis and menopause Osteoporosis is a disease in which the bones lose minerals and strength with aging. This can result in bone fractures. If you are 42 years old or older, or if you are at risk for osteoporosis and fractures, ask your health care provider if you should: Be screened for bone loss. Take a calcium  or vitamin D  supplement to lower your risk of fractures. Be given hormone replacement therapy (HRT) to treat symptoms of menopause. Follow these instructions at home: Alcohol use Do not drink alcohol if: Your health care provider tells you not to drink. You are pregnant, may be pregnant, or are planning to become pregnant. If you drink alcohol: Limit how much you have to: 0-1 drink a day. Know how much alcohol is in your drink. In the [place] one drink equals one 12 oz bottle of beer (355 mL), one 5 oz glass of wine (148 mL), or one 1 oz glass of hard liquor (44 mL). Lifestyle Do not use any products that contain nicotine or tobacco. These products include cigarettes, chewing tobacco, and vaping devices, such as e-cigarettes. If you need help quitting, ask your health care provider. Do not use street drugs. Do not share needles. Ask your health care provider for help if you need support or information about quitting drugs. General instructions Schedule regular health, dental, and eye exams. Stay current with your vaccines. Tell your health care provider if: You often feel depressed. You have ever been abused or do not feel safe at home. This information is not intended to replace advice given to you by your health care provider. Make sure you discuss any questions you have with your health care provider. Document Revised: 08/26/2023 Document Reviewed: 07/09/2020 Elsevier Patient Education  2025 [employer].

## 2024-04-07 NOTE — Progress Notes (Unsigned)
 "  Subjective:  Patient ID: Angela Lam, female    DOB: 02-16-1961  Age: 64 y.o. MRN: [id number]  CC: Annual Exam and Hyperlipidemia   HPI Angela Lam presents for a CPX and f/up ---  Discussed the use of AI scribe software for clinical note transcription with the patient, who gave verbal consent to proceed.  History of Present Illness Angela Lam is a 64 year old female who presents for a routine follow-up visit.  She maintains a regular exercise routine using a treadmill without experiencing chest pain, shortness of breath, dizziness, lightheadedness, or palpitations during exercise.  She takes trazodone  for sleep, which is generally effective, though she occasionally has difficulty falling asleep. No side effects from trazodone  are reported.  She has a history of elevated blood pressure readings during previous visits but reports normal readings at home. She recalls an instance of elevated blood pressure when receiving a Prolia  shot, attributing it to rushing to the appointment.  She is on a statin and reports no muscle aches or other side effects with her current medication, although she had issues with Lipitor in the past.  She is up to date with her flu shot but has not received a recent COVID booster.  She acknowledges being overdue for a mammogram and a gynecological exam.  No chest pain, shortness of breath, dizziness, lightheadedness, palpitations, stomachaches, nausea, vomiting, diarrhea, or constipation.     Outpatient Medications Prior to Visit  Medication Sig Dispense Refill   aspirin  EC 81 MG tablet Take 1 tablet (81 mg total) by mouth every other day. 90 tablet 1   BIOTIN PO Take by mouth.     Calcium Carbonate-Vitamin D  600-400 MG-UNIT tablet      cholecalciferol (VITAMIN D3) 25 MCG (1000 UNIT) tablet Take 2,000 Units by mouth daily.     denosumab  (PROLIA ) 60 MG/ML SOSY injection INJECT 60MG  INTO THE SKIN EVERY 6 MONTHS. 1 mL 1   simvastatin  (ZOCOR )  40 MG tablet Take 1 tablet (40 mg total) by mouth at bedtime. 90 tablet 1   traZODone  (DESYREL ) 50 MG tablet Take 1 tablet (50 mg total) by mouth at bedtime. 28 tablet 0   citalopram  (CELEXA ) 20 MG tablet Take 1 tablet (20 mg total) by mouth in the morning. Patient needs to follow up with the office prior to future refills being placed 90 tablet 0   Facility-Administered Medications Prior to Visit  Medication Dose Route Frequency Provider Last Rate Last Admin   [START ON 07/22/2024] denosumab  (PROLIA ) injection 60 mg  60 mg Subcutaneous Once Shamleffer, Ibtehal Jaralla, MD        ROS Review of Systems  Psychiatric/Behavioral:  Positive for sleep disturbance.     Objective:  BP 128/86 (BP Location: Left Arm, Patient Position: Sitting, Cuff Size: Normal)   Pulse 88   Temp 98.5 F (36.9 C) (Oral)   Resp 16   Ht 5' (1.524 m)   Wt 138 lb (62.6 kg)   SpO2 98%   BMI 26.95 kg/m   BP Readings from Last 3 Encounters:  04/07/24 128/86  01/22/24 (!) 146/60  07/17/23 128/70    Wt Readings from Last 3 Encounters:  04/07/24 138 lb (62.6 kg)  01/22/24 136 lb 6.4 oz (61.9 kg)  07/17/23 134 lb 12.8 oz (61.1 kg)    Physical Exam  Lab Results  Component Value Date   WBC 7.1 04/07/2024   HGB 14.8 04/07/2024   HCT 43.9 04/07/2024   PLT 288.0 04/07/2024  GLUCOSE 90 04/07/2024   CHOL 169 04/07/2024   TRIG 60.0 04/07/2024   HDL 82.90 04/07/2024   LDLCALC 74 04/07/2024   ALT 23 04/07/2024   AST 22 04/07/2024   NA 141 04/07/2024   K 4.0 04/07/2024   CL 103 04/07/2024   CREATININE 0.72 04/07/2024   BUN 17 04/07/2024   CO2 32 04/07/2024   TSH 1.40 04/07/2024    CT CORONARY MORPH W/CTA COR W/SCORE W/CA W/CM &/OR WO/CM Addendum Date: 07/23/2023 ADDENDUM REPORT: 07/23/2023 23:47 EXAM: OVER-READ INTERPRETATION  CT CHEST The following report is an over-read performed by radiologist Dr. Oneil Devonshire of [facility] on 07/23/2023. This over-read does not include interpretation of  cardiac or coronary anatomy or pathology. The coronary calcium score/coronary CTA interpretation by the cardiologist is attached. COMPARISON:  09/03/2022 FINDINGS: Cardiovascular: There are no significant extracardiac vascular findings. Mediastinum/Nodes: There are no enlarged lymph nodes within the visualized mediastinum. Lungs/Pleura: There is no pleural effusion. The visualized lungs appear clear. Upper abdomen: No significant findings in the visualized upper abdomen. Musculoskeletal/Chest wall: No chest wall mass or suspicious osseous findings within the visualized chest. IMPRESSION: No significant extracardiac findings within the visualized chest. Electronically Signed   By: Oneil Devonshire M.D.   On: 07/23/2023 23:47   Result Date: 07/23/2023 CLINICAL DATA:  64 Year-old Female EXAM: Cardiac/Coronary CTA TECHNIQUE: A non-contrast, gated CT scan was obtained with axial slices of 3 mm through the heart for calcium scoring. Calcium scoring was performed using the Agatston method. A 120 kV prospective, gated, contrast cardiac scan was obtained. Gantry rotation speed was 250 msecs and collimation was 0.6 mm. Two sublingual nitroglycerin  tablets (0.8 mg) were given. The 3D data set was reconstructed in 5% intervals of the 35-75% of the R-R cycle. Diastolic phases were analyzed on a dedicated workstation using MPR, MIP, and VRT modes. The patient received 100 cc of contrast. FINDINGS: Coronary Arteries:  Normal coronary origin.  Right dominance. Coronary Calcium Score: Left main: 0 Left anterior descending artery: 3 Left circumflex artery: 0 Right coronary artery: 0 Ramus intermedius artery: 0 Total: 3 Percentile: 61st for age, sex, and race matched control. Left main: The left main is a large caliber vessel with a normal take off from the left coronary cusp that bifurcates to form a left anterior descending artery, ramus intermedius, and a left circumflex artery. There is no significant plaque. Left anterior  descending artery: The LAD is a large caliber vessel with one diagonal vessel anatomy. Minimal (1-24%) mixed plaque in the proximal LAD. Left circumflex artery: The LCX is non-dominant. There is no significant plaque. Right coronary artery: The RCA is dominant with normal take off from the right coronary cusp. the RCA terminates as a PDA and two right posterolateral branch. Mild (25-49%) luminal narrowing in the proximal RCA. There is a ramus intermedius vessel that bifurcates. There is no significant plaque. Other non-coronary findings: Right Atrium: Right atrial size is dilated. Septum bows to the left. Right Ventricle: The right ventricular cavity is dilated. Left Atrium: Left atrial size is normal in size with no left atrial appendage filling defect. Left Ventricle: The ventricular cavity size is within normal limits. Interatrial septum: Small PFO. Main Pulmonary Artery: Normal size of the pulmonary artery. Pulmonary veins: Normal pulmonary venous drainage. Pericardium: Normal thickness without significant effusion or calcium present. Cardiac valves: The aortic valve is trileaflet without significant calcification. The mitral valve is normal without significant calcification. Aorta: Normal caliber. Extra-cardiac findings: See attached radiology report  for non-cardiac structures. Artifact: NA Image quality: Good IMPRESSION: 1. Coronary calcium score of 3. This was 61st percentile for age, sex, and race matched control. 2. Normal coronary origin with right dominance. 3. CAD-RADS 2. Mild non-obstructive CAD (25-49%). Consider non-atherosclerotic causes of chest pain. Consider preventive therapy and risk factor modification. RECOMMENDATIONS: RECOMMENDATIONS The proposed cut-off value of 1,651 AU yielded a 93 % sensitivity and 75 % specificity in grading AS severity in patients with classical low-flow, low-gradient AS. Proposed different cut-off values to define severe AS for men and women as 2,065 AU and 1,274 AU,  respectively. The joint European and American recommendations for the assessment of AS consider the aortic valve calcium score as a continuum - a very high calcium score suggests severe AS and a low calcium score suggests severe AS is unlikely. Donney VEAR Jarome LULLA Stephen RENETTE, et al. 2017 ESC/EACTS Guidelines for the management of valvular heart disease. Eur Heart J 2017;38:2739-91. Coronary artery calcium (CAC) score is a strong predictor of incident coronary heart disease (CHD) and provides predictive information beyond traditional risk factors. CAC scoring is reasonable to use in the decision to withhold, postpone, or initiate statin therapy in intermediate-risk or selected borderline-risk asymptomatic adults (age 60-75 years and LDL-C >=70 to <190 mg/dL) who do not have diabetes or established atherosclerotic cardiovascular disease (ASCVD).* In intermediate-risk (10-year ASCVD risk >=7.5% to <20%) adults or selected borderline-risk (10-year ASCVD risk >=5% to <7.5%) adults in whom a CAC score is measured for the purpose of making a treatment decision the following recommendations have been made: If CAC = 0, it is reasonable to withhold statin therapy and reassess in 5 to 10 years, as long as higher risk conditions are absent (diabetes mellitus, family history of premature CHD in first degree relatives (males <55 years; females <65 years), cigarette smoking, LDL >=190 mg/dL or other independent risk factors). If CAC is 1 to 99, it is reasonable to initiate statin therapy for patients >=44 years of age. If CAC is >=100 or >=75th percentile, it is reasonable to initiate statin therapy at any age. Cardiology referral should be considered for patients with CAC scores =400 or >=75th percentile. *2018 AHA/ACC/AACVPR/AAPA/ABC/ACPM/ADA/AGS/APhA/ASPC/NLA/PCNA Guideline on the Management of Blood Cholesterol: A Report of the American College of Cardiology/American Heart Association Task Force on Clinical Practice Guidelines.  J Am Coll Cardiol. 2019;73(24):3168-3209. Stanly Leavens, MD Electronically Signed: By: Stanly Leavens M.D. On: 07/16/2023 13:15    Assessment & Plan:  Osteoporosis without current pathological fracture, unspecified osteoporosis type -     Basic metabolic panel with GFR; Future -     Phosphorus; Future -     VITAMIN D  25 Hydroxy (Vit-D Deficiency, Fractures); Future -     CBC with Differential/Platelet; Future -     Hepatic function panel; Future  Immunization due -     Pneumococcal conjugate vaccine 20-valent  Screening mammogram for breast cancer -     3D Screening Mammogram, Left and Right; Future  Encounter for general adult medical examination with abnormal findings -     Lipid panel; Future  Hyperlipidemia with target LDL less than 130 -     Lipid panel; Future -     CBC with Differential/Platelet; Future -     Hepatic function panel; Future -     TSH; Future  GAD (generalized anxiety disorder) -     Citalopram  Hydrobromide; Take 1 tablet (20 mg total) by mouth in the morning.  Dispense: 90 tablet; Refill: 1  Follow-up: Return in about 6 months (around 10/05/2024).  Debby Molt, MD "

## 2024-04-08 ENCOUNTER — Inpatient Hospital Stay: Admission: RE | Admit: 2024-04-08

## 2024-04-08 DIAGNOSIS — Z1231 Encounter for screening mammogram for malignant neoplasm of breast: Secondary | ICD-10-CM
